# Patient Record
Sex: Female | Born: 1960 | Race: White | Hispanic: No | Marital: Single | Smoking: Former smoker
Health system: Southern US, Community
[De-identification: ages and names within clinical notes are randomized; demographics above are authoritative.]

## PROBLEM LIST (undated history)

## (undated) DIAGNOSIS — M8580 Other specified disorders of bone density and structure, unspecified site: Secondary | ICD-10-CM

## (undated) DIAGNOSIS — I341 Nonrheumatic mitral (valve) prolapse: Secondary | ICD-10-CM

## (undated) DIAGNOSIS — R252 Cramp and spasm: Secondary | ICD-10-CM

## (undated) DIAGNOSIS — R350 Frequency of micturition: Secondary | ICD-10-CM

## (undated) DIAGNOSIS — H9203 Otalgia, bilateral: Secondary | ICD-10-CM

## (undated) DIAGNOSIS — Q892 Congenital malformations of other endocrine glands: Secondary | ICD-10-CM

## (undated) DIAGNOSIS — K219 Gastro-esophageal reflux disease without esophagitis: Secondary | ICD-10-CM

## (undated) DIAGNOSIS — F419 Anxiety disorder, unspecified: Secondary | ICD-10-CM

## (undated) HISTORY — DX: Nonrheumatic mitral (valve) prolapse: I34.1

## (undated) HISTORY — DX: Anxiety disorder, unspecified: F41.9

## (undated) HISTORY — PX: TUBAL LIGATION: SHX77

---
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
## 1998-07-17 ENCOUNTER — Other Ambulatory Visit: Admission: RE | Admit: 1998-07-17 | Discharge: 1998-07-17 | Payer: Self-pay | Admitting: Gynecology

## 1999-07-31 ENCOUNTER — Other Ambulatory Visit: Admission: RE | Admit: 1999-07-31 | Discharge: 1999-07-31 | Payer: Self-pay | Admitting: Gynecology

## 2000-09-10 ENCOUNTER — Other Ambulatory Visit: Admission: RE | Admit: 2000-09-10 | Discharge: 2000-09-10 | Payer: Self-pay | Admitting: Gynecology

## 2001-05-29 ENCOUNTER — Encounter: Payer: Self-pay | Admitting: Family Medicine

## 2001-05-29 ENCOUNTER — Ambulatory Visit (HOSPITAL_COMMUNITY): Admission: RE | Admit: 2001-05-29 | Discharge: 2001-05-29 | Payer: Self-pay | Admitting: Family Medicine

## 2001-07-29 ENCOUNTER — Emergency Department (HOSPITAL_COMMUNITY): Admission: EM | Admit: 2001-07-29 | Discharge: 2001-07-29 | Payer: Self-pay

## 2001-09-15 ENCOUNTER — Other Ambulatory Visit: Admission: RE | Admit: 2001-09-15 | Discharge: 2001-09-15 | Payer: Self-pay | Admitting: Gynecology

## 2002-06-15 ENCOUNTER — Ambulatory Visit (HOSPITAL_COMMUNITY): Admission: RE | Admit: 2002-06-15 | Discharge: 2002-06-15 | Payer: Self-pay | Admitting: Family Medicine

## 2002-06-15 ENCOUNTER — Encounter: Payer: Self-pay | Admitting: Family Medicine

## 2002-09-20 ENCOUNTER — Other Ambulatory Visit: Admission: RE | Admit: 2002-09-20 | Discharge: 2002-09-20 | Payer: Self-pay | Admitting: Gynecology

## 2003-09-26 ENCOUNTER — Other Ambulatory Visit: Admission: RE | Admit: 2003-09-26 | Discharge: 2003-09-26 | Payer: Self-pay | Admitting: Gynecology

## 2004-10-09 ENCOUNTER — Other Ambulatory Visit: Admission: RE | Admit: 2004-10-09 | Discharge: 2004-10-09 | Payer: Self-pay | Admitting: Gynecology

## 2005-10-07 ENCOUNTER — Other Ambulatory Visit: Admission: RE | Admit: 2005-10-07 | Discharge: 2005-10-07 | Payer: Self-pay | Admitting: Gynecology

## 2006-10-06 ENCOUNTER — Other Ambulatory Visit: Admission: RE | Admit: 2006-10-06 | Discharge: 2006-10-06 | Payer: Self-pay | Admitting: Gynecology

## 2007-10-20 ENCOUNTER — Other Ambulatory Visit: Admission: RE | Admit: 2007-10-20 | Discharge: 2007-10-20 | Payer: Self-pay | Admitting: Gynecology

## 2008-10-20 ENCOUNTER — Other Ambulatory Visit: Admission: RE | Admit: 2008-10-20 | Discharge: 2008-10-20 | Payer: Self-pay | Admitting: Gynecology

## 2009-11-03 ENCOUNTER — Encounter: Admission: RE | Admit: 2009-11-03 | Discharge: 2009-11-03 | Payer: Self-pay | Admitting: Gynecology

## 2011-01-13 ENCOUNTER — Encounter: Payer: Self-pay | Admitting: Gynecology

## 2012-03-26 ENCOUNTER — Other Ambulatory Visit: Payer: Self-pay | Admitting: Physician Assistant

## 2012-04-21 ENCOUNTER — Other Ambulatory Visit: Payer: Self-pay | Admitting: Physician Assistant

## 2012-08-05 ENCOUNTER — Other Ambulatory Visit: Payer: Self-pay | Admitting: Physician Assistant

## 2012-08-06 NOTE — Telephone Encounter (Signed)
Chart pulled [id number]

## 2012-08-12 DIAGNOSIS — R350 Frequency of micturition: Secondary | ICD-10-CM | POA: Insufficient documentation

## 2012-08-12 HISTORY — DX: Frequency of micturition: R35.0

## 2012-10-14 ENCOUNTER — Other Ambulatory Visit: Payer: Self-pay | Admitting: Gynecology

## 2012-10-14 DIAGNOSIS — N6459 Other signs and symptoms in breast: Secondary | ICD-10-CM

## 2012-10-16 ENCOUNTER — Ambulatory Visit
Admission: RE | Admit: 2012-10-16 | Discharge: 2012-10-16 | Disposition: A | Discharge status: Home / Self Care | Payer: PRIVATE HEALTH INSURANCE | Source: Ambulatory Visit | Attending: Gynecology | Admitting: Gynecology

## 2012-10-16 DIAGNOSIS — N6459 Other signs and symptoms in breast: Secondary | ICD-10-CM

## 2012-10-29 ENCOUNTER — Other Ambulatory Visit: Payer: Self-pay | Admitting: Family Medicine

## 2012-10-29 DIAGNOSIS — M79609 Pain in unspecified limb: Secondary | ICD-10-CM

## 2012-11-04 ENCOUNTER — Other Ambulatory Visit: Payer: PRIVATE HEALTH INSURANCE

## 2012-11-05 ENCOUNTER — Other Ambulatory Visit: Payer: PRIVATE HEALTH INSURANCE

## 2012-11-13 ENCOUNTER — Other Ambulatory Visit: Payer: PRIVATE HEALTH INSURANCE

## 2012-11-25 ENCOUNTER — Other Ambulatory Visit: Payer: Self-pay | Admitting: Physician Assistant

## 2012-11-25 NOTE — Telephone Encounter (Signed)
Chart pulled to PA pool at nurses station [phone]

## 2013-02-23 ENCOUNTER — Other Ambulatory Visit: Payer: Self-pay | Admitting: Gynecology

## 2013-02-23 DIAGNOSIS — R928 Other abnormal and inconclusive findings on diagnostic imaging of breast: Secondary | ICD-10-CM

## 2013-03-04 ENCOUNTER — Ambulatory Visit
Admission: RE | Admit: 2013-03-04 | Discharge: 2013-03-04 | Disposition: A | Discharge status: Home / Self Care | Payer: PRIVATE HEALTH INSURANCE | Source: Ambulatory Visit | Attending: Gynecology | Admitting: Gynecology

## 2013-03-04 DIAGNOSIS — R928 Other abnormal and inconclusive findings on diagnostic imaging of breast: Secondary | ICD-10-CM

## 2013-04-02 DIAGNOSIS — F411 Generalized anxiety disorder: Secondary | ICD-10-CM | POA: Insufficient documentation

## 2013-04-02 DIAGNOSIS — F17200 Nicotine dependence, unspecified, uncomplicated: Secondary | ICD-10-CM | POA: Insufficient documentation

## 2013-04-02 DIAGNOSIS — M899 Disorder of bone, unspecified: Secondary | ICD-10-CM

## 2013-04-02 HISTORY — DX: Nicotine dependence, unspecified, uncomplicated: F17.200

## 2013-04-02 HISTORY — DX: Generalized anxiety disorder: F41.1

## 2013-04-02 HISTORY — DX: Disorder of bone, unspecified: M89.9

## 2013-04-14 ENCOUNTER — Encounter: Payer: Self-pay | Admitting: Internal Medicine

## 2013-06-11 ENCOUNTER — Encounter: Payer: PRIVATE HEALTH INSURANCE | Admitting: Internal Medicine

## 2013-07-07 ENCOUNTER — Encounter: Payer: Self-pay | Admitting: Internal Medicine

## 2013-10-01 ENCOUNTER — Encounter: Payer: PRIVATE HEALTH INSURANCE | Admitting: Internal Medicine

## 2013-10-22 ENCOUNTER — Ambulatory Visit (INDEPENDENT_AMBULATORY_CARE_PROVIDER_SITE_OTHER): Payer: PRIVATE HEALTH INSURANCE | Admitting: Cardiovascular Disease

## 2013-10-22 ENCOUNTER — Encounter: Payer: Self-pay | Admitting: Cardiovascular Disease

## 2013-10-22 VITALS — BP 90/68 | HR 66 | Ht 65.0 in | Wt 120.0 lb

## 2013-10-22 DIAGNOSIS — I059 Rheumatic mitral valve disease, unspecified: Secondary | ICD-10-CM

## 2013-10-22 DIAGNOSIS — I341 Nonrheumatic mitral (valve) prolapse: Secondary | ICD-10-CM

## 2013-10-22 DIAGNOSIS — R002 Palpitations: Secondary | ICD-10-CM

## 2013-10-25 ENCOUNTER — Encounter: Payer: Self-pay | Admitting: Cardiovascular Disease

## 2013-11-24 ENCOUNTER — Encounter: Payer: Self-pay | Admitting: Internal Medicine

## 2013-11-26 NOTE — Progress Notes (Signed)
Patient ID: Michele Jacobs, female   DOB: 1961-02-03, 52 y.o.   MRN: [id number]     HPI: Michele Jacobs is a 52 y.o. female is a former patient of Dr. Alanda Amass. [facility].  Michele Jacobs last saw Dr. Alanda Amass in July 2013. She is now 52 years old she has a history of palpitations, mitral valve prolapse with minimal mitral regurgitation, and has experienced episodic palpitations. She also has had difficulty with polyuria in the past for which she had seen Dr. Logan Bores. In 2009 she had developed some episodes of chest discomfort with atypical features. A Myoview scan was negative for ischemia. Her last echo Doppler study was in July 2013 which showed normal LV size and function. There was borderline mitral valve prolapse with trace MR. Mild TR. She had normal pulmonary pressures.  Her recent medications have included alprazolam which he takes on a when necessary basis. For periods of increased stress and anxiety. She has been on Toprol XL 25 mg which have controlled her palpitations.  She denies any episodes of chest pain. At times she has noticed an intermittent cough and nasal congestion. She denies any episodes of presyncope or syncope.  Past Medical History  Diagnosis Date  . Mitral valve prolapse   . Anxiety     Past Surgical History  Procedure Laterality Date  . Tubal ligation      Allergies  Allergen Reactions  . Codeine Nausea And Vomiting    Current Outpatient Prescriptions  Medication Sig Dispense Refill  . ALPRAZolam (XANAX) 0.5 MG tablet Take 0.5 tablets by mouth at bedtime as needed.      . TOPROL XL 25 MG 24 hr tablet Take 1 tablet by mouth daily.       No current facility-administered medications for this visit.    History   Social History  . Marital Status: Single    Spouse Name: N/A    Number of Children: N/A  . Years of Education: N/A   Occupational History  . Not on file.   Social History Main Topics  . Smoking  status: Former Smoker    Types: Cigarettes  . Smokeless tobacco: Never Used     Comment: quit in 2009  . Alcohol Use: Not on file  . Drug Use: No  . Sexual Activity: Not on file   Other Topics Concern  . Not on file   Social History Narrative  . No narrative on file    Family History  Problem Relation Age of Onset  . Ovarian cancer Mother   . Lymphoma Father   . Heart disease Father     ROS is negative for fevers, chills or night sweats. She denies change in vision or hearing. She is unaware of lymphadenopathy. She denies bleeding problems. There is a history of palpitations but these have been controlled with beta blocker therapy. At times she does have nasal congestion. There is no chest pain. She denies wheezing. She denies nausea vomiting or diarrhea. There is no claudication. There are no myalgias. There is no diabetes. There is no history of thyroid problems. She denies sleep disordered breathing. Other comprehensive 14 point system review is negative.  PE BP 90/68  Pulse 66  Ht 5\' 5"  (1.651 m)  Wt 120 lb (54.432 kg)  BMI 19.97 kg/m2  Repeat blood pressure 100/70 General: Alert, oriented, no distress.  Skin: normal turgor, no rashes HEENT: Normocephalic, atraumatic. Pupils round and reactive; sclera anicteric;no  lid lag. Extraocular muscles intact. Nose without nasal septal hypertrophy Mouth/Parynx benign; Mallinpatti scale 2 Neck: No JVD, no carotid bruits; normal carotid upstroke Lungs: clear to ausculatation and percussion; no wheezing or rales Chest wall: no tenderness to palpitation Heart: RRR, s1 s2 normal short early 1/6 systolic murmur at the left border. No click. No diastolic murmur or rub. No S3 gallop Abdomen: soft, nontender; no hepatosplenomehaly, BS+; abdominal aorta nontender and not dilated by palpation. Back: no CVA tenderness Pulses 2+ Extremities: no clubbing cyanosis or edema, Homan's sign negative  Neurologic: grossly nonfocal; cranial nerves  grossly normal. Psychologic: normal affect and mood.  ECG (independently read by me): Normal sinus rhythm at 66 beats per minute. No significant ST-T changes. Normal intervals.  LABS:   I did review laboratory from 04/02/2013 done at [facility]. I took out 5 hemoglobin 13 hematocrit 36.6. Renal function was normal with a BUN of 15 creatinine 0.7. Potassium 4.0. She had normal liver function studies.  Total cholesterol 177 triglycerides 49 HDL 86 VLDL 10 LDL cholesterol 80. Vitamin D level 35. Negative urinalysis.  BMET    Component Value Date/Time   NA 141 12/09/2013 2015   K 3.9 12/09/2013 2015   CL 105 12/09/2013 2015   CO2 29 12/09/2013 2015   GLUCOSE 121* 12/09/2013 2015   BUN 19 12/09/2013 2015   CREATININE 0.68 12/09/2013 2015   CALCIUM 9.7 12/09/2013 2015     Hepatic Function Panel  No results found for this basename: prot, albumin, ast, alt, alkphos, bilitot, bilidir, ibili     CBC    Component Value Date/Time   WBC 3.9* 12/13/2013 0801   RBC 4.25 12/13/2013 0801   HGB 13.2 12/13/2013 0801   HCT 41.4 12/13/2013 0801   MCV 97.4* 12/13/2013 0801   MCH 31.1 12/13/2013 0801   MCHC 31.9 12/13/2013 0801     BNP No results found for this basename: probnp    Lipid Panel  No results found for this basename: chol, trig, hdl, cholhdl, vldl, ldlcalc     RADIOLOGY: No results found.    ASSESSMENT AND PLAN: Clinically, Michele Jacobs is remaining stable from a cardiovascular standpoint. Her blood pressure is somewhat low, but she denies associated symptoms of lightheadedness or dizziness. Her EKG is stable. She's not having any palpitations on her low dose beta blocker therapy. I reviewed her prior echocardiographic data from 2013. Her laboratory is within normal limits when taken at [facility]. She will continue her current regimen. I will see her in one year for followup evaluation or sooner if problems  arise.   Lennette Bihari, MD, [facility]  01/20/2014 11:09 PM

## 2013-11-27 ENCOUNTER — Encounter: Payer: Self-pay | Admitting: Cardiovascular Disease

## 2013-12-09 ENCOUNTER — Ambulatory Visit: Payer: No Typology Code available for payment source

## 2013-12-09 ENCOUNTER — Ambulatory Visit (INDEPENDENT_AMBULATORY_CARE_PROVIDER_SITE_OTHER): Payer: No Typology Code available for payment source | Admitting: Family Medicine

## 2013-12-09 VITALS — BP 118/62 | HR 78 | Temp 98.3°F | Resp 16 | Ht 65.0 in | Wt 119.0 lb

## 2013-12-09 DIAGNOSIS — R0602 Shortness of breath: Secondary | ICD-10-CM

## 2013-12-09 DIAGNOSIS — D72819 Decreased white blood cell count, unspecified: Secondary | ICD-10-CM

## 2013-12-09 DIAGNOSIS — R0789 Other chest pain: Secondary | ICD-10-CM

## 2013-12-09 LAB — POCT CBC
Granulocyte percent: 48.4 %G (ref 37–80)
HCT, POC: 39.1 % (ref 37.7–47.9)
Lymph, poc: 1.8 (ref 0.6–3.4)
MCHC: 30.4 g/dL — AB (ref 31.8–35.4)
MCV: 98.4 fL — AB (ref 80–97)
MID (cbc): 0.4 (ref 0–0.9)
POC Granulocyte: 2.1 (ref 2–6.9)
POC LYMPH PERCENT: 42.5 %L (ref 10–50)
Platelet Count, POC: 196 10*3/uL (ref 142–424)
RDW, POC: 12.7 %

## 2013-12-09 NOTE — Progress Notes (Addendum)
[facility] [address] [place] [address] [phone]  Date:  12/09/2013   Name:  Michele Jacobs   DOB:  [birth year]   MRN:  [id number]  PCP:  No primary provider on file.    Chief Complaint: Shortness of Breath   History of Present Illness:  Michele Jacobs is a 52 y.o. very pleasant female patient who presents with the following:  She is here today with possible illness.  She notes that she has felt "SOB" lately for the last 3 or 4 days.  She has a slight cough- dry.  Otherwise she has felt well, no fevers or chills. She has not had this in the past. She did used to be a smoker but quit years ago.  She feels like she will have to take sort of a double breath to get a deep breath in.  She has not been around smokers or any other allergens that she is aware of.   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  No runny nose, no sneezing.   No ST.   No CP, but she does describe a feeling of tightness in her chest that can occur at random, with no relationship to exertion and last for a few minutes- maybe 3 or 4 minutes.  She is not able to further describe how often this may occur or for how long she has noted it.  "I have all these random pains in my body."  Feels that her soreness may be due to her dog sleeping on her  She uses toprol, xanax prn.   No hormone therapy, no OCP.   No history of DVT or PE.   No recent surgery or long trips  She also notes that she may get leg cramps at night, this has gone on for several months.  There are no active problems to display for this patient.   Past Medical History  Diagnosis Date  . Mitral valve prolapse   . Anxiety     Past Surgical History  Procedure Laterality Date  . Tubal ligation      History  Substance Use Topics  . Smoking status: Former Smoker    Types: Cigarettes  . Smokeless tobacco: Never Used     Comment: quit in  2009  . Alcohol Use: Not on file    Family History  Problem Relation Age of Onset  . Ovarian cancer Mother   . Lymphoma Father   . Heart disease Father     Allergies  Allergen Reactions  . Codeine Nausea And Vomiting    Medication list has been reviewed and updated.  Current Outpatient Prescriptions on File Prior to Visit  Medication Sig Dispense Refill  . ALPRAZolam (XANAX) 0.5 MG tablet Take 0.5 tablets by mouth at bedtime as needed.      . TOPROL XL 25 MG 24 hr tablet Take 1 tablet by mouth daily.       No current facility-administered medications on file prior to visit.    Review of Systems:  As per HPI- otherwise negative.   Physical Examination: Filed Vitals:   12/09/13 1942  BP: 118/62  Pulse: 78  Temp: 98.3 F (36.8 C)  Resp: 16   Filed Vitals:   12/09/13 1942  Height: 5\' 5"  (1.651 m)  Weight: 119 lb (53.978 kg)   Body mass index is 19.8 kg/(m^2).  Ideal Body Weight: Weight in (lb) to have BMI = 25: 149.9  GEN: WDWN, NAD, Non-toxic, A & O x 3, looks well HEENT: Atraumatic, Normocephalic. Neck supple. No masses, No LAD.  Bilateral TM wnl, oropharynx normal.  PEERL,EOMI.   Ears and Nose: No external deformity. CV: RRR, No M/G/R. No JVD. No thrill. No extra heart sounds. PULM: CTA B, no wheezes, crackles, rhonchi. No retractions. No resp. distress. No accessory muscle use. ABD: S, NT, ND, +BS. No rebound. No HSM. EXTR: No c/c/e.  No calf swelling or tenderness NEURO Normal gait.  PSYCH: Normally interactive. Conversant. Not depressed or anxious appearing.  Calm demeanor.   [facility] reading (PRIMARY) by  Dr. Patsy Lager. CXR:  Negative  CHEST 2 VIEW  COMPARISON: August 31, 2008  FINDINGS: There is no edema or consolidation. The heart size and pulmonary vascularity are normal. No adenopathy. There is mild lower thoracic levoscoliosis.  IMPRESSION: No edema or consolidation.  EKG:  NSR, no ST elevation or depression.  No tachycardia  Results for  orders placed in visit on 12/09/13  POCT CBC      Result Value Range   WBC 4.3 (*) 4.6 - 10.2 K/uL   Lymph, poc 1.8  0.6 - 3.4   POC LYMPH PERCENT 42.5  10 - 50 %L   MID (cbc) 0.4  0 - 0.9   POC MID % 9.1  0 - 12 %M   POC Granulocyte 2.1  2 - 6.9   Granulocyte percent 48.4  37 - 80 %G   RBC 3.97 (*) 4.04 - 5.48 M/uL   Hemoglobin 11.9 (*) 12.2 - 16.2 g/dL   HCT, POC 16.1  09.6 - 47.9 %   MCV 98.4 (*) 80 - 97 fL   MCH, POC 30.0  27 - 31.2 pg   MCHC 30.4 (*) 31.8 - 35.4 g/dL   RDW, POC 04.5     Platelet Count, POC 196  142 - 424 K/uL   MPV 9.8  0 - 99.8 fL    Assessment and Plan: Shortness of breath - Plan: DG Chest 2 View  Chest tightness - Plan: POCT CBC, Basic metabolic panel, EKG 12-Lead  Michele Jacobs is here today with somewhat vague SOB for the last several days, described as a sensation of having to take a 2nd breath to get a deep breath.  Also describes chest tightness that can occur at random for a few minutes now and then.    Explained that I cannot find a definite explanation for her sx here at clinic today.  It is possible that she has a dangerous heart or lung issue.  I am certainly happy to arrange an ED evaluation for more in- depth investigation of her SOB and chest tighteness.  She declines this for now, prefers to monitor her condition at home and seek care if her sx do not remit  BMP due to recent complaint of leg cramps at night  Signed Abbe Amsterdam, MD  Called to talk to her.  She had not eaten anything for several hours prior to her blood draw.  She will come in for a lab visit only soon for a CBC and A1c.

## 2013-12-10 LAB — BASIC METABOLIC PANEL
BUN: 19 mg/dL (ref 6–23)
Calcium: 9.7 mg/dL (ref 8.4–10.5)
Glucose, Bld: 121 mg/dL — ABNORMAL HIGH (ref 70–99)
Potassium: 3.9 mEq/L (ref 3.5–5.3)

## 2013-12-12 ENCOUNTER — Encounter: Payer: Self-pay | Admitting: Family Medicine

## 2013-12-12 NOTE — Addendum Note (Signed)
Addended by: Abbe Amsterdam C on: 12/12/2013 08:32 PM   Modules accepted: Orders

## 2013-12-13 ENCOUNTER — Ambulatory Visit (INDEPENDENT_AMBULATORY_CARE_PROVIDER_SITE_OTHER): Payer: No Typology Code available for payment source | Admitting: Physician Assistant

## 2013-12-13 DIAGNOSIS — D72819 Decreased white blood cell count, unspecified: Secondary | ICD-10-CM

## 2013-12-13 DIAGNOSIS — R0602 Shortness of breath: Secondary | ICD-10-CM

## 2013-12-13 LAB — POCT CBC
HCT, POC: 41.4 % (ref 37.7–47.9)
Hemoglobin: 13.2 g/dL (ref 12.2–16.2)
Lymph, poc: 1.3 (ref 0.6–3.4)
MCH, POC: 31.1 pg (ref 27–31.2)
MCHC: 31.9 g/dL (ref 31.8–35.4)
MCV: 97.4 fL — AB (ref 80–97)
RBC: 4.25 M/uL (ref 4.04–5.48)
WBC: 3.9 10*3/uL — AB (ref 4.6–10.2)

## 2013-12-13 NOTE — Progress Notes (Signed)
Patient here for labs only. 

## 2013-12-14 ENCOUNTER — Telehealth: Payer: Self-pay

## 2013-12-14 ENCOUNTER — Telehealth: Payer: Self-pay | Admitting: Family Medicine

## 2013-12-14 DIAGNOSIS — D72819 Decreased white blood cell count, unspecified: Secondary | ICD-10-CM

## 2013-12-14 NOTE — Telephone Encounter (Signed)
Patient is returning dr coplands call [phone]

## 2013-12-14 NOTE — Telephone Encounter (Signed)
Called and left detailed message.  A1c is ok- no diabetes. Her wbc count is still a bit low- likely due to recent viral infection.  Will place an order for repeat CBC; lets do this in about one month, lab visit only.  If leukopenia persists then will evaluate further

## 2013-12-15 NOTE — Telephone Encounter (Signed)
Spoke to pt. She is now feeling like she has the flu. Body aches, chest congestion, fever and cough. She states she does not have time to come back into the office as I have advised. She is sure that her count was low due to her coming down with this illness. She will consider coming back to the clinic for blood work in one month.

## 2014-01-20 ENCOUNTER — Encounter: Payer: Self-pay | Admitting: Cardiovascular Disease

## 2014-01-20 DIAGNOSIS — R002 Palpitations: Secondary | ICD-10-CM

## 2014-01-20 DIAGNOSIS — I341 Nonrheumatic mitral (valve) prolapse: Secondary | ICD-10-CM | POA: Insufficient documentation

## 2014-01-20 HISTORY — DX: Palpitations: R00.2

## 2014-01-27 ENCOUNTER — Other Ambulatory Visit: Payer: Self-pay

## 2014-01-27 DIAGNOSIS — Z1231 Encounter for screening mammogram for malignant neoplasm of breast: Secondary | ICD-10-CM

## 2014-02-14 ENCOUNTER — Ambulatory Visit: Payer: 59

## 2014-02-14 ENCOUNTER — Ambulatory Visit (INDEPENDENT_AMBULATORY_CARE_PROVIDER_SITE_OTHER): Payer: 59 | Admitting: Family Medicine

## 2014-02-14 ENCOUNTER — Encounter: Payer: Self-pay | Admitting: Family Medicine

## 2014-02-14 VITALS — BP 102/54 | HR 59 | Temp 98.2°F | Resp 16 | Ht 64.75 in | Wt 116.2 lb

## 2014-02-14 DIAGNOSIS — M25552 Pain in left hip: Secondary | ICD-10-CM

## 2014-02-14 DIAGNOSIS — Z23 Encounter for immunization: Secondary | ICD-10-CM

## 2014-02-14 DIAGNOSIS — M25559 Pain in unspecified hip: Secondary | ICD-10-CM

## 2014-02-14 DIAGNOSIS — Z0000 Encounter for general adult medical examination without abnormal findings: Secondary | ICD-10-CM

## 2014-02-14 DIAGNOSIS — Z13220 Encounter for screening for lipoid disorders: Secondary | ICD-10-CM

## 2014-02-14 DIAGNOSIS — F411 Generalized anxiety disorder: Secondary | ICD-10-CM

## 2014-02-14 DIAGNOSIS — M533 Sacrococcygeal disorders, not elsewhere classified: Secondary | ICD-10-CM

## 2014-02-14 DIAGNOSIS — R002 Palpitations: Secondary | ICD-10-CM

## 2014-02-14 LAB — CBC WITH DIFFERENTIAL/PLATELET
BASOS ABS: 0 10*3/uL (ref 0.0–0.1)
BASOS PCT: 1 % (ref 0–1)
Eosinophils Absolute: 0.1 10*3/uL (ref 0.0–0.7)
Eosinophils Relative: 3 % (ref 0–5)
HCT: 39.8 % (ref 36.0–46.0)
Hemoglobin: 13.5 g/dL (ref 12.0–15.0)
LYMPHS PCT: 34 % (ref 12–46)
Lymphs Abs: 1.4 10*3/uL (ref 0.7–4.0)
MCH: 31.3 pg (ref 26.0–34.0)
MCHC: 33.9 g/dL (ref 30.0–36.0)
MCV: 92.1 fL (ref 78.0–100.0)
Monocytes Absolute: 0.4 10*3/uL (ref 0.1–1.0)
Monocytes Relative: 9 % (ref 3–12)
NEUTROS ABS: 2.2 10*3/uL (ref 1.7–7.7)
Neutrophils Relative %: 53 % (ref 43–77)
PLATELETS: 211 10*3/uL (ref 150–400)
RBC: 4.32 MIL/uL (ref 3.87–5.11)
RDW: 13.8 % (ref 11.5–15.5)
WBC: 4.2 10*3/uL (ref 4.0–10.5)

## 2014-02-14 LAB — COMPREHENSIVE METABOLIC PANEL
ALBUMIN: 4.5 g/dL (ref 3.5–5.2)
ALK PHOS: 80 U/L (ref 39–117)
ALT: 13 U/L (ref 0–35)
AST: 18 U/L (ref 0–37)
BUN: 16 mg/dL (ref 6–23)
CALCIUM: 9.8 mg/dL (ref 8.4–10.5)
CHLORIDE: 105 meq/L (ref 96–112)
CO2: 27 mEq/L (ref 19–32)
CREATININE: 0.69 mg/dL (ref 0.50–1.10)
Glucose, Bld: 91 mg/dL (ref 70–99)
POTASSIUM: 4.9 meq/L (ref 3.5–5.3)
Sodium: 140 mEq/L (ref 135–145)
Total Bilirubin: 1.2 mg/dL (ref 0.2–1.2)
Total Protein: 6.7 g/dL (ref 6.0–8.3)

## 2014-02-14 LAB — LIPID PANEL
CHOL/HDL RATIO: 2.2 ratio
Cholesterol: 178 mg/dL (ref 0–200)
HDL: 80 mg/dL (ref >39)
LDL CALC: 88 mg/dL (ref 0–99)
Triglycerides: 52 mg/dL (ref <150)
VLDL: 10 mg/dL (ref 0–40)

## 2014-02-14 LAB — TSH: TSH: 1.041 u[IU]/mL (ref 0.350–4.500)

## 2014-02-14 MED ORDER — MELOXICAM 7.5 MG PO TABS: 7.5000 mg | ORAL_TABLET | Freq: Every day | ORAL | Status: DC

## 2014-02-14 MED ORDER — METOPROLOL SUCCINATE ER 25 MG PO TB24: 25.0000 mg | ORAL_TABLET | Freq: Every day | ORAL | Status: DC

## 2014-02-14 NOTE — Progress Notes (Deleted)
[facility] [address], [place] [address] [phone]  Date:  02/14/2014   Name:  Michele Jacobs   DOB:  06-09-1961   MRN:  [id number]  PCP:  No PCP Per Patient    Chief Complaint: Annual Exam   History of Present Illness:  Michele Jacobs is a 53 y.o. very pleasant female patient who presents with the following:  ***  Patient Active Problem List   Diagnosis Date Noted  . Mitral valve prolapse 01/20/2014  . Palpitations 01/20/2014    Past Medical History  Diagnosis Date  . Mitral valve prolapse   . Anxiety     Past Surgical History  Procedure Laterality Date  . Tubal ligation      History  Substance Use Topics  . Smoking status: Former Smoker    Types: Cigarettes  . Smokeless tobacco: Never Used     Comment: quit in 2009  . Alcohol Use: Not on file    Family History  Problem Relation Age of Onset  . Ovarian cancer Mother   . Lymphoma Father   . Heart disease Father     Allergies  Allergen Reactions  . Codeine Nausea And Vomiting    Medication list has been reviewed and updated.  Current Outpatient Prescriptions on File Prior to Visit  Medication Sig Dispense Refill  . ALPRAZolam (XANAX) 0.5 MG tablet Take 0.5 tablets by mouth at bedtime as needed.      . TOPROL XL 25 MG 24 hr tablet Take 1 tablet by mouth daily.       No current facility-administered medications on file prior to visit.    Review of Systems:  ***  Physical Examination: There were no vitals filed for this visit. There were no vitals filed for this visit. There is no weight on file to calculate BMI. Ideal Body Weight:    ***  Assessment and Plan: ***  Signed Abbe AmsterdamJessica Kayelee Herbig, MD

## 2014-02-14 NOTE — Progress Notes (Signed)
[facility] [address] [place] [address] [phone]  Date:  02/14/2014   Name:  Michele Jacobs   DOB:  March 10, 1961   MRN:  [id number]  PCP:  No PCP Per Patient    Chief Complaint: Annual Exam   History of Present Illness:  Michele Jacobs is a 53 y.o. very pleasant female patient who presents with the following:  Here today for a CPE.  She was seen in the fall with illness and noted to have mild leukopenia, and slightly high blood glucose. However her A1c was ok at 5.4%.  She is fasting today.   She is doing well today and her only concern is that she has noted some pain in her tailbone- more at night- for the last several weeks. She is not aware of any injury and has not fallen.  Also, sometimes her left hip will feel sore- this comes and goes.   She does see Dr. Chevis Pretty for her OB care.  She will have a mammogram next week as well. She has had a left nipple inversion and keeps a close eye on her mammograms.  So far all ok.   She has not yet had a colonoscopy.  She did have one scheduled but her father got very worried about possible complications  and she canceled it.    She is active at her jobs, but does not do a lot of dedicated exercise  She does take toprol for palpitations daily. She takes xanax 0.25 at bedtime; she takes a 1/2 of a 0.5  Her mother did die of ovarian cancer; she brought a copy of her old labs with her and her OBG did test a CA-125 last year that looked ok.    Patient Active Problem List   Diagnosis Date Noted  . Mitral valve prolapse 01/20/2014  . Palpitations 01/20/2014    Past Medical History  Diagnosis Date  . Mitral valve prolapse   . Anxiety     Past Surgical History  Procedure Laterality Date  . Tubal ligation      History  Substance Use Topics  . Smoking status: Former Smoker    Types: Cigarettes  . Smokeless tobacco: Never Used     Comment: quit in 2009  . Alcohol Use: Not on file    Family History   Problem Relation Age of Onset  . Ovarian cancer Mother   . Lymphoma Father   . Heart disease Father     Allergies  Allergen Reactions  . Codeine Nausea And Vomiting    Medication list has been reviewed and updated.  Current Outpatient Prescriptions on File Prior to Visit  Medication Sig Dispense Refill  . ALPRAZolam (XANAX) 0.5 MG tablet Take 0.5 tablets by mouth at bedtime as needed.      . TOPROL XL 25 MG 24 hr tablet Take 1 tablet by mouth daily.       No current facility-administered medications on file prior to visit.    Review of Systems:  As per HPI- otherwise negative.   Physical Examination: Filed Vitals:   02/14/14 0831  BP: 102/54  Pulse: 59  Temp: 98.2 F (36.8 C)  Resp: 16   Filed Vitals:   02/14/14 0831  Height: 5' 4.75" (1.645 m)  Weight: 116 lb 3.2 oz (52.708 kg)   Body mass index is 19.48 kg/(m^2). Ideal Body Weight: Weight in (lb) to have BMI = 25: 148.8  GEN: WDWN, NAD, Non-toxic, A &  O x 3, slim build, looks well HEENT: Atraumatic, Normocephalic. Neck supple. No masses, No LAD. Ears and Nose: No external deformity. CV: RRR, No M/G/R. No JVD. No thrill. No extra heart sounds. PULM: CTA B, no wheezes, crackles, rhonchi. No retractions. No resp. distress. No accessory muscle use. ABD: S, NT, ND, +BS. No rebound. No HSM. EXTR: No c/c/e NEURO Normal gait.  PSYCH: Normally interactive. Conversant. Not depressed or anxious appearing.  Calm demeanor.  Palpated over her sacrum: it is non- tender, no masses, heat or fluctuance Both hips with normal ROM to internal and external rotation and flexion.  Non- tender over left greater trochanter.   [facility] reading (PRIMARY) by  Dr. Patsy Lageropland. Left ZOX:WRUEAVWUhip:negative Pelvis: negative  SACRUM AND COCCYX - 2+ VIEW  COMPARISON: DG HIP COMPLETE 2+V*L* dated 02/14/2014  FINDINGS: No definite displaced sacral or coccygeal fracture.  Limited visualization of the bilateral SI joints, pubic symphysis and hips is  normal. Regional soft tissues appear normal.  IMPRESSION: No displaced sacral or coccygeal fracture.   EXAM: LEFT HIP - COMPLETE 2+ VIEW  COMPARISON: None.  FINDINGS: No fracture dislocation. Left hip joint spaces appear preserved. No evidence avascular necrosis.  Regional soft tissues appear normal. No radiopaque foreign body.  IMPRESSION: No explanation for patient's left-sided hip pain.   Results for orders placed in visit on 12/13/13  POCT CBC      Result Value Ref Range   WBC 3.9 (*) 4.6 - 10.2 K/uL   Lymph, poc 1.3  0.6 - 3.4   POC LYMPH PERCENT 34.4  10 - 50 %L   MID (cbc) 0.4  0 - 0.9   POC MID % 9.1  0 - 12 %M   POC Granulocyte 2.2  2 - 6.9   Granulocyte percent 56.5  37 - 80 %G   RBC 4.25  4.04 - 5.48 M/uL   Hemoglobin 13.2  12.2 - 16.2 g/dL   HCT, POC 98.141.4  19.137.7 - 47.9 %   MCV 97.4 (*) 80 - 97 fL   MCH, POC 31.1  27 - 31.2 pg   MCHC 31.9  31.8 - 35.4 g/dL   RDW, POC 47.812.9     Platelet Count, POC 193  142 - 424 K/uL   MPV 9.8  0 - 99.8 fL  POCT GLYCOSYLATED HEMOGLOBIN (HGB A1C)      Result Value Ref Range   Hemoglobin A1C 5.4      Assessment and Plan: Physical exam - Plan: CBC with Differential, Comprehensive metabolic panel, TSH  Need for prophylactic vaccination with combined diphtheria-tetanus-pertussis (DTP) vaccine - Plan: Tdap vaccine greater than or equal to 7yo IM  Sacral pain - Plan: DG Sacrum/Coccyx, CANCELED: DG Pelvis 1-2 Views  Left hip pain - Plan: DG Hip Complete Left  Palpitations - Plan: metoprolol succinate (TOPROL XL) 25 MG 24 hr tablet  GAD (generalized anxiety disorder)  Screening for hyperlipidemia - Plan: Lipid panel  Coccydynia - Plan: meloxicam (MOBIC) 7.5 MG tablet  CME today- doing well, she will catch up on her pap and mammogram with OBG.  Encouraged her to reschedule her colonoscopy as she plans to do so.  Tdap today.   QHS xanax for GAD Will try prn mobic of her coccydynia See patient instructions for more  details.   Will plan further follow- up pending labs.   Signed Abbe AmsterdamJessica [name], MD

## 2014-02-14 NOTE — Patient Instructions (Signed)
Consider rescheduling your colonoscopy. This is of course your choice, but I do think your risk of colon cancer is greater than your risk of serious complications from the test.   Use the mobic as needed for pain in your tailbone.  If it does not help please let me know; try taking it for a week or two and see if it helps.    I will be in touch with your labs.   Please ask your OB-GYN; we do not routinely do the CA-125 test.  However if they want this done we can add it on for you.   Ask your OB also about you bone density

## 2014-03-07 ENCOUNTER — Other Ambulatory Visit: Payer: Self-pay

## 2014-03-07 ENCOUNTER — Ambulatory Visit
Admission: RE | Admit: 2014-03-07 | Discharge: 2014-03-07 | Disposition: A | Discharge status: Home / Self Care | Payer: 59 | Source: Ambulatory Visit

## 2014-03-07 DIAGNOSIS — Z1231 Encounter for screening mammogram for malignant neoplasm of breast: Secondary | ICD-10-CM

## 2014-04-02 ENCOUNTER — Other Ambulatory Visit: Payer: Self-pay | Admitting: Family Medicine

## 2014-05-05 ENCOUNTER — Encounter: Payer: Self-pay | Admitting: Family Medicine

## 2014-05-05 DIAGNOSIS — F411 Generalized anxiety disorder: Secondary | ICD-10-CM

## 2014-05-05 MED ORDER — ALPRAZOLAM 0.5 MG PO TABS: 0.2500 mg | ORAL_TABLET | Freq: Every evening | ORAL | Status: DC | PRN

## 2014-06-14 ENCOUNTER — Ambulatory Visit (INDEPENDENT_AMBULATORY_CARE_PROVIDER_SITE_OTHER): Payer: 59 | Admitting: Emergency Medicine

## 2014-06-14 VITALS — BP 126/71 | HR 70 | Temp 98.3°F | Resp 16 | Ht 65.0 in | Wt 120.8 lb

## 2014-06-14 DIAGNOSIS — F4323 Adjustment disorder with mixed anxiety and depressed mood: Secondary | ICD-10-CM

## 2014-06-14 NOTE — Progress Notes (Signed)
[facility] [address], [place] [address] [phone]  Date:  06/14/2014   Name:  Michele NordmannJanice Y Jacobs   DOB:  1961/12/08   MRN:  [id number]  PCP:  Abbe AmsterdamOPLAND,JESSICA, MD    Chief Complaint: Shortness of Breath   History of Present Illness:  Michele NordmannJanice Y Jacobs is a 53 y.o. very pleasant female patient who presents with the following:  Says she has a 3 week history of burning in her throat when she is outside in the hot air.  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  No cough or coryza.  No fever or chills.  No hemoptysis, wheezing, orthopnea or PND.  Some shortness of breath at rest.  Feels as though she is not getting a satisfying breath.  Anxious.  Caring for father with end stage cancer after her mother died in 2010 of cancer.  Eating normally.  Poor sleep and often tired.  No improvement with over the counter medications or other home remedies. Denies other complaint or health concern today.   Patient Active Problem List   Diagnosis Date Noted  . Mitral valve prolapse 01/20/2014  . Palpitations 01/20/2014    Past Medical History  Diagnosis Date  . Mitral valve prolapse   . Anxiety     Past Surgical History  Procedure Laterality Date  . Tubal ligation      History  Substance Use Topics  . Smoking status: Former Smoker    Types: Cigarettes  . Smokeless tobacco: Never Used     Comment: quit in 2009  . Alcohol Use: Not on file    Family History  Problem Relation Age of Onset  . Ovarian cancer Mother   . Cancer Mother   . Lymphoma Father   . Heart disease Father   . Cancer Father     Allergies  Allergen Reactions  . Codeine Nausea And Vomiting    Medication list has been reviewed and updated.  Current Outpatient Prescriptions on File Prior to Visit  Medication Sig Dispense Refill  . ALPRAZolam (XANAX) 0.5 MG tablet Take 0.5 tablets (0.25 mg total) by mouth at bedtime as needed.  30 tablet  1  . TOPROL XL 25 MG  24 hr tablet TAKE 1 TABLET EVERY DAY  30 tablet  11   No current facility-administered medications on file prior to visit.    Review of Systems:  As per HPI, otherwise negative.    Physical Examination: Filed Vitals:   06/14/14 1548  BP: 126/71  Pulse: 70  Temp: 98.3 F (36.8 C)  Resp: 16   Filed Vitals:   06/14/14 1548  Height: 5\' 5"  (1.651 m)  Weight: 120 lb 12.8 oz (54.795 kg)   Body mass index is 20.1 kg/(m^2). Ideal Body Weight: Weight in (lb) to have BMI = 25: 149.9  GEN: WDWN, NAD, Non-toxic, A & O x 3 HEENT: Atraumatic, Normocephalic. Neck supple. No masses, No LAD. Ears and Nose: No external deformity. CV: RRR, No M/G/R. No JVD. No thrill. No extra heart sounds. PULM: CTA B, no wheezes, crackles, rhonchi. No retractions. No resp. distress. No accessory muscle use. ABD: S, NT, ND, +BS. No rebound. No HSM. EXTR: No c/c/e NEURO Normal gait.  PSYCH: Normally interactive. Conversant. Not depressed appearing.  Anxious demeanor.    Assessment and Plan: Anxiety reaction Depression Refuses medication   Signed,  Phillips OdorJeffery Jodell Weitman, MD

## 2014-07-06 ENCOUNTER — Other Ambulatory Visit: Payer: Self-pay | Admitting: Family Medicine

## 2014-07-06 DIAGNOSIS — G4700 Insomnia, unspecified: Secondary | ICD-10-CM

## 2014-07-07 NOTE — Telephone Encounter (Signed)
Called in.

## 2014-08-22 DIAGNOSIS — R252 Cramp and spasm: Secondary | ICD-10-CM

## 2014-08-22 DIAGNOSIS — H9203 Otalgia, bilateral: Secondary | ICD-10-CM

## 2014-08-22 HISTORY — DX: Cramp and spasm: R25.2

## 2014-08-22 HISTORY — DX: Otalgia, bilateral: H92.03

## 2015-02-09 ENCOUNTER — Other Ambulatory Visit: Payer: Self-pay | Admitting: Family Medicine

## 2015-02-10 NOTE — Telephone Encounter (Signed)
Dr Patsy Lageropland, you haven't seen pt for a year, but she does have appt sch for 04/24/15.

## 2015-02-11 NOTE — Telephone Encounter (Signed)
rx printed.  Meds ordered this encounter  Medications  . ALPRAZolam (XANAX) 0.5 MG tablet    Sig: TAKE 1/2 TABLET AT BEDTIME AS NEEDED FOR ANXIETY    Dispense:  30 tablet    Refill:  0    This request is for a new prescription for a controlled substance as required by Federal/State law..Marland Kitchen

## 2015-02-11 NOTE — Telephone Encounter (Signed)
Please approve #[id number] xanax .5 for her- she takes 1/2 qhs.  Thank you

## 2015-02-13 NOTE — Telephone Encounter (Signed)
Faxed

## 2015-02-21 LAB — HM MAMMOGRAPHY: HM MAMMO: NORMAL (ref 0–4)

## 2015-02-27 ENCOUNTER — Other Ambulatory Visit: Payer: Self-pay

## 2015-02-27 MED ORDER — TOPROL XL 25 MG PO TB24: 25.0000 mg | ORAL_TABLET | Freq: Every day | ORAL | Status: DC

## 2015-04-24 ENCOUNTER — Encounter: Payer: Self-pay | Admitting: Family Medicine

## 2015-04-24 ENCOUNTER — Ambulatory Visit (INDEPENDENT_AMBULATORY_CARE_PROVIDER_SITE_OTHER): Payer: 59 | Admitting: Family Medicine

## 2015-04-24 VITALS — BP 94/66 | HR 63 | Temp 98.6°F | Resp 16 | Ht 65.0 in | Wt 115.0 lb

## 2015-04-24 DIAGNOSIS — Z130 Encounter for screening for diseases of the blood and blood-forming organs and certain disorders involving the immune mechanism: Secondary | ICD-10-CM | POA: Diagnosis not present

## 2015-04-24 DIAGNOSIS — Z0000 Encounter for general adult medical examination without abnormal findings: Secondary | ICD-10-CM | POA: Diagnosis not present

## 2015-04-24 DIAGNOSIS — M8580 Other specified disorders of bone density and structure, unspecified site: Secondary | ICD-10-CM

## 2015-04-24 DIAGNOSIS — R002 Palpitations: Secondary | ICD-10-CM | POA: Diagnosis not present

## 2015-04-24 DIAGNOSIS — Z13220 Encounter for screening for lipoid disorders: Secondary | ICD-10-CM

## 2015-04-24 DIAGNOSIS — Z131 Encounter for screening for diabetes mellitus: Secondary | ICD-10-CM | POA: Diagnosis not present

## 2015-04-24 DIAGNOSIS — R17 Unspecified jaundice: Secondary | ICD-10-CM | POA: Diagnosis not present

## 2015-04-24 DIAGNOSIS — Z1211 Encounter for screening for malignant neoplasm of colon: Secondary | ICD-10-CM

## 2015-04-24 DIAGNOSIS — L710 Perioral dermatitis: Secondary | ICD-10-CM

## 2015-04-24 HISTORY — DX: Other specified disorders of bone density and structure, unspecified site: M85.80

## 2015-04-24 LAB — CBC
HCT: 40.2 % (ref 36.0–46.0)
Hemoglobin: 13.2 g/dL (ref 12.0–15.0)
MCH: 30.9 pg (ref 26.0–34.0)
MCHC: 32.8 g/dL (ref 30.0–36.0)
MCV: 94.1 fL (ref 78.0–100.0)
MPV: 10.8 fL (ref 8.6–12.4)
PLATELETS: 235 10*3/uL (ref 150–400)
RBC: 4.27 MIL/uL (ref 3.87–5.11)
RDW: 12.8 % (ref 11.5–15.5)
WBC: 4.8 10*3/uL (ref 4.0–10.5)

## 2015-04-24 LAB — COMPREHENSIVE METABOLIC PANEL
ALK PHOS: 90 U/L (ref 39–117)
ALT: 16 U/L (ref 0–35)
AST: 18 U/L (ref 0–37)
Albumin: 4.5 g/dL (ref 3.5–5.2)
BUN: 17 mg/dL (ref 6–23)
CHLORIDE: 105 meq/L (ref 96–112)
CO2: 27 meq/L (ref 19–32)
Calcium: 9.8 mg/dL (ref 8.4–10.5)
Creat: 0.68 mg/dL (ref 0.50–1.10)
Glucose, Bld: 85 mg/dL (ref 70–99)
POTASSIUM: 4.5 meq/L (ref 3.5–5.3)
SODIUM: 142 meq/L (ref 135–145)
TOTAL PROTEIN: 6.8 g/dL (ref 6.0–8.3)
Total Bilirubin: 1.8 mg/dL — ABNORMAL HIGH (ref 0.2–1.2)

## 2015-04-24 LAB — LIPID PANEL
Cholesterol: 175 mg/dL (ref 0–200)
HDL: 83 mg/dL (ref >=46)
LDL CALC: 80 mg/dL (ref 0–99)
Total CHOL/HDL Ratio: 2.1 Ratio
Triglycerides: 58 mg/dL (ref <150)
VLDL: 12 mg/dL (ref 0–40)

## 2015-04-24 LAB — TSH: TSH: 1.306 u[IU]/mL (ref 0.350–4.500)

## 2015-04-24 MED ORDER — TOPROL XL 25 MG PO TB24: 25.0000 mg | ORAL_TABLET | Freq: Every day | ORAL | Status: DC

## 2015-04-24 MED ORDER — METRONIDAZOLE 1 % EX GEL: Freq: Every day | CUTANEOUS | Status: DC

## 2015-04-24 MED ORDER — ALPRAZOLAM 0.25 MG PO TABS: ORAL_TABLET | ORAL | Status: DC

## 2015-04-24 NOTE — Patient Instructions (Signed)
I will be in touch with your labs asap Keep up the good work with exercise.  I might also encourage you to gain just a few lbs; this may help keep your bones strong  Try the metrogel once a day on your chin rash. You can also try a mild OTC hydrocortisone cream a couple of times a week for short term (a few weeks)  In between these medications use a mild moisturizer such as cetaphil cream.   Let me know if this does not help in 2-3 weeks

## 2015-04-24 NOTE — Progress Notes (Signed)
[facility] [address], [place] [address] [phone]  Date:  04/24/2015   Name:  Michele NordmannJanice Y Cuffee   DOB:  1961/05/14   MRN:  [id number]  PCP:  Abbe AmsterdamOPLAND,Ninah Moccio, MD    Chief Complaint: Annual Exam and Rash   History of Present Illness:  Michele NordmannJanice Y Kazlauskas is a 54 y.o. very pleasant female patient who presents with the following:  Here today for a CPE.  Her mamo and pap are UTD.  Immunizations also UTD.   She has not yet had a colonoscopy and is concerned about possible perforation accidentls.  She would like to do a virtual colonoscopy; she understands that this may mean going back in again for a polypectomy  She would like to do a vitamin D level today.  She had a dexa scan at her OBG recently which showed osteopenia, stable from 2 years ago.  She is fasting today for labs.   She is working on the treadmill at her dad's place and also walking outside some.   She notes a rash on her chin over the last couple of months- she has been using some OTC acne products for this but it does not seem to be helping  She uses xanax a couple of times a week   She uses toprol xl for her heart palp- she needs brand only Patient Active Problem List   Diagnosis Date Noted  . Mitral valve prolapse 01/20/2014  . Palpitations 01/20/2014    Past Medical History  Diagnosis Date  . Mitral valve prolapse   . Anxiety     Past Surgical History  Procedure Laterality Date  . Tubal ligation      History  Substance Use Topics  . Smoking status: Former Smoker    Types: Cigarettes  . Smokeless tobacco: Never Used     Comment: quit in 2009  . Alcohol Use: Yes    Family History  Problem Relation Age of Onset  . Ovarian cancer Mother   . Cancer Mother   . Lymphoma Father   . Heart disease Father   . Cancer Father   . Non-Hodgkin's lymphoma Father     Allergies  Allergen Reactions  . Codeine Nausea And Vomiting    Medication list has been reviewed and  updated.  Current Outpatient Prescriptions on File Prior to Visit  Medication Sig Dispense Refill  . ALPRAZolam (XANAX) 0.5 MG tablet TAKE 1/2 TABLET AT BEDTIME AS NEEDED FOR ANXIETY 30 tablet 0  . TOPROL XL 25 MG 24 hr tablet Take 1 tablet (25 mg total) by mouth daily. PATIENT NEEDS OFFICE VISIT FOR ADDITIONAL REFILLS 30 tablet 0   No current facility-administered medications on file prior to visit.    Review of Systems:  As per HPI- otherwise negative.   Physical Examination: Filed Vitals:   04/24/15 0842  BP: 94/66  Pulse: 63  Temp: 98.6 F (37 C)  Resp: 16   Filed Vitals:   04/24/15 0842  Height: 5\' 5"  (1.651 m)  Weight: 115 lb (52.164 kg)   Body mass index is 19.14 kg/(m^2). Ideal Body Weight: Weight in (lb) to have BMI = 25: 149.9  GEN: WDWN, NAD, Non-toxic, A & O x 3, small, slight build HEENT: Atraumatic, Normocephalic. Neck supple. No masses, No LAD.  Bilateral TM wnl, oropharynx normal.  PEERL,EOMI.   Ears and Nose: No external deformity. CV: RRR, No M/G/R. No JVD. No thrill. No extra heart sounds. PULM: CTA B, no  wheezes, crackles, rhonchi. No retractions. No resp. distress. No accessory muscle use. ABD: S, NT, ND . No rebound. No HSM. EXTR: No c/c/e NEURO Normal gait.  PSYCH: Normally interactive. Conversant. Not depressed or anxious appearing.  Calm demeanor.  Perioral dermatitis on her bilateral chin with irritation   Assessment and Plan: Physical exam  Heart palpitations - Plan: ALPRAZolam (XANAX) 0.25 MG tablet, TOPROL XL 25 MG 24 hr tablet, TSH  Screening for hyperlipidemia - Plan: Lipid panel  Osteopenia - Plan: Vitamin D, 25-hydroxy, TSH  Screening for diabetes mellitus - Plan: Comprehensive metabolic panel  Screening for deficiency anemia - Plan: CBC  Perioral dermatitis - Plan: metroNIDAZOLE (METROGEL) 1 % gel  Screening for colon cancer - Plan: CT Virtual Colonoscopy Diagnostic   Did her refills. Advised her to DC OTC acne products  which are likely making her skin worse- will try metrogel instead, let me know if not better soon Did referral for virtual colon Will plan further follow- up pending labs. Encouraged her to try and gain a little weight   Signed Abbe Amsterdam, MD

## 2015-04-25 LAB — VITAMIN D 25 HYDROXY (VIT D DEFICIENCY, FRACTURES): Vit D, 25-Hydroxy: 27 ng/mL — ABNORMAL LOW (ref 30–100)

## 2015-04-25 NOTE — Addendum Note (Signed)
Addended by: Abbe AmsterdamOPLAND, Vertis Bauder C on: 04/25/2015 09:01 PM   Modules accepted: Orders

## 2015-04-27 ENCOUNTER — Encounter: Payer: Self-pay | Admitting: Family Medicine

## 2015-05-01 ENCOUNTER — Telehealth: Payer: Self-pay

## 2015-05-01 MED ORDER — METRONIDAZOLE 0.75 % EX GEL: CUTANEOUS | Status: DC

## 2015-05-01 NOTE — Telephone Encounter (Signed)
PA denied for Metronidazole gel. The insurance will only cover 0.75% gel. Please advise on medication change.

## 2015-05-22 ENCOUNTER — Encounter: Payer: Self-pay | Admitting: Family Medicine

## 2015-05-22 DIAGNOSIS — L309 Dermatitis, unspecified: Secondary | ICD-10-CM

## 2015-05-30 ENCOUNTER — Telehealth: Payer: Self-pay

## 2015-06-06 ENCOUNTER — Telehealth: Payer: Self-pay

## 2015-06-06 NOTE — Telephone Encounter (Signed)
Please call for peer to peer thanks  Patient Name: Michele Jacobs Patient Id: [id number] Insurance Carrier: [employer]   Diagnosis: Z12.11  Description: Encounter for screening for malignant neoplasm of colon  CPT Code 83254  Description: CT COLONOGRAPHY, DIAGNOSTIC  Case Number: [id number]  Review Date: 04/25/2015 9:45:45 AM  Expiration Date: N/A

## 2015-06-06 NOTE — Telephone Encounter (Signed)
Peer to Peer will not allow the ct colonography without indications of coagulation disorder, recent bleeds, problem with a previous colonoscopy.  We will consider another modality at this time for colon cancer screening, and continue to counsel her of colonoscopy.

## 2015-10-30 ENCOUNTER — Ambulatory Visit: Payer: 59 | Admitting: Family Medicine

## 2015-11-01 ENCOUNTER — Encounter: Payer: Self-pay | Admitting: Family Medicine

## 2015-11-01 ENCOUNTER — Ambulatory Visit (INDEPENDENT_AMBULATORY_CARE_PROVIDER_SITE_OTHER): Payer: 59 | Admitting: Family Medicine

## 2015-11-01 VITALS — BP 124/62 | HR 66 | Temp 98.1°F | Resp 16 | Ht 65.0 in | Wt 120.4 lb

## 2015-11-01 DIAGNOSIS — Z119 Encounter for screening for infectious and parasitic diseases, unspecified: Secondary | ICD-10-CM | POA: Diagnosis not present

## 2015-11-01 DIAGNOSIS — E559 Vitamin D deficiency, unspecified: Secondary | ICD-10-CM | POA: Diagnosis not present

## 2015-11-01 DIAGNOSIS — Z23 Encounter for immunization: Secondary | ICD-10-CM | POA: Diagnosis not present

## 2015-11-01 DIAGNOSIS — H6991 Unspecified Eustachian tube disorder, right ear: Secondary | ICD-10-CM

## 2015-11-01 DIAGNOSIS — H6981 Other specified disorders of Eustachian tube, right ear: Secondary | ICD-10-CM | POA: Diagnosis not present

## 2015-11-01 DIAGNOSIS — F411 Generalized anxiety disorder: Secondary | ICD-10-CM | POA: Diagnosis not present

## 2015-11-01 LAB — HEPATIC FUNCTION PANEL
ALK PHOS: 90 U/L (ref 33–130)
ALT: 15 U/L (ref 6–29)
AST: 19 U/L (ref 10–35)
Albumin: 4.3 g/dL (ref 3.6–5.1)
BILIRUBIN INDIRECT: 1.1 mg/dL (ref 0.2–1.2)
Bilirubin, Direct: 0.3 mg/dL — ABNORMAL HIGH (ref <=0.2)
TOTAL PROTEIN: 6.4 g/dL (ref 6.1–8.1)
Total Bilirubin: 1.4 mg/dL — ABNORMAL HIGH (ref 0.2–1.2)

## 2015-11-01 NOTE — Progress Notes (Signed)
[facility] [address], [place] [address] [phone]  Date:  11/01/2015   Name:  Michele Jacobs   DOB:  06-10-1961   MRN:  [id number]  PCP:  Abbe Amsterdam, MD    Chief Complaint: Follow-up; Anxiety; and Referral to ENT   History of Present Illness:  Michele Jacobs is a 54 y.o. very pleasant female patient who presents with the following:  Here today for a 6 month follow-up visit.  Physical in May of this year.   She has gained 5 lbs.  She does not really like the weight gain but knows it is a healthier weight for her  Her anxiety is about the same Uses xanax a couple of times a week Needs flu shot today  She is taking her vitamin D supplement- will recheck level today  She has noted her right ear popping, thinks that her ET tube probably came out.   - needs a referral back to her ENT, she sees Dr. Jearld Fenton.  He placed a right TM tube back in 2015  She is a former smoker, uses toprol for palpitations  Wt Readings from Last 3 Encounters:  11/01/15 120 lb 6.4 oz (54.613 kg)  04/24/15 115 lb (52.164 kg)  06/14/14 120 lb 12.8 oz (54.795 kg)     Patient Active Problem List   Diagnosis Date Noted  . Heart palpitations 04/24/2015  . Osteopenia 04/24/2015  . Mitral valve prolapse 01/20/2014  . Palpitations 01/20/2014    Past Medical History  Diagnosis Date  . Mitral valve prolapse   . Anxiety     Past Surgical History  Procedure Laterality Date  . Tubal ligation      Social History  Substance Use Topics  . Smoking status: Former Smoker    Types: Cigarettes  . Smokeless tobacco: Never Used     Comment: quit in 2009  . Alcohol Use: Yes    Family History  Problem Relation Age of Onset  . Ovarian cancer Mother   . Cancer Mother   . Lymphoma Father   . Heart disease Father   . Cancer Father   . Non-Hodgkin's lymphoma Father     Allergies  Allergen Reactions  . Codeine Nausea And Vomiting    Medication list has been reviewed  and updated.  Current Outpatient Prescriptions on File Prior to Visit  Medication Sig Dispense Refill  . ALPRAZolam (XANAX) 0.25 MG tablet Take 1 daily as needed 30 tablet 1  . TOPROL XL 25 MG 24 hr tablet Take 1 tablet (25 mg total) by mouth daily. Brand only please 30 tablet 11   No current facility-administered medications on file prior to visit.    Review of Systems:  As per HPI- otherwise negative.   Physical Examination: Filed Vitals:   11/01/15 0808  BP: 124/62  Pulse: 66  Temp: 98.1 F (36.7 C)  Resp: 16   Filed Vitals:   11/01/15 0808  Height:  (1.651 m)  Weight: 120 lb 6.4 oz (54.613 kg)   Body mass index is 20.04 kg/(m^2). Ideal Body Weight: Weight in (lb) to have BMI = 25: 149.9  GEN: WDWN, NAD, Non-toxic, A & O x 3, slim build but looks better, has gained a few labs HEENT: Atraumatic, Normocephalic. Neck supple. No masses, No LAD. Right Tm tube is sitting in ear canal.  Could not easily remove so left in place Ears and Nose: No external deformity. CV: RRR, No M/G/R. No JVD.  No thrill. No extra heart sounds. PULM: CTA B, no wheezes, crackles, rhonchi. No retractions. No resp. distress. No accessory muscle use. EXTR: No c/c/e NEURO Normal gait.  PSYCH: Normally interactive. Conversant. Not depressed or anxious appearing.  Calm demeanor.    Assessment and Plan: GAD (generalized anxiety disorder)  Flu vaccine need - Plan: Flu Vaccine QUAD 36+ mos IM  Vitamin D deficiency - Plan: Vitamin D, 25-hydroxy  Screening examination for infectious disease - Plan: Hepatitis C antibody  Hyperbilirubinemia - Plan: Hepatic Function Panel  ETD (eustachian tube dysfunction), right - Plan: Ambulatory referral to ENT  Flu shot today Her anxiety is stable, she will let me know when xanax RF needed Repeat vitamin D level to follow-up on deficiency at last visit Also recheck elevated bilirubin Screen for hep c Referral back to her ENT  Signed Abbe AmsterdamJessica Titianna Loomis,  MD

## 2015-11-01 NOTE — Patient Instructions (Signed)
It was good to see you today! I will be in touch with your labs asap I made the referral to Dr. Jearld FentonByers but did not schedule an appt since you are an established pt.  You can call and schedule an appt time that works for you We will check on your vitamin D, recheck your bilirubin level and do your one-time hepatitis C screening today Let's plan to recheck in 6 months

## 2015-11-02 LAB — HEPATITIS C ANTIBODY: HCV Ab: NEGATIVE

## 2015-11-02 LAB — VITAMIN D 25 HYDROXY (VIT D DEFICIENCY, FRACTURES): Vit D, 25-Hydroxy: 23 ng/mL — ABNORMAL LOW (ref 30–100)

## 2015-11-02 MED ORDER — VITAMIN D (ERGOCALCIFEROL) 1.25 MG (50000 UNIT) PO CAPS: 50000.0000 [IU] | ORAL_CAPSULE | ORAL | Status: DC

## 2015-11-02 NOTE — Addendum Note (Signed)
Addended by: Abbe AmsterdamOPLAND, [name] on: 11/02/2015 04:30 PM   Modules accepted: Orders

## 2015-11-08 ENCOUNTER — Encounter: Payer: Self-pay | Admitting: Family Medicine

## 2015-11-08 ENCOUNTER — Ambulatory Visit (INDEPENDENT_AMBULATORY_CARE_PROVIDER_SITE_OTHER): Payer: 59 | Admitting: Physician Assistant

## 2015-11-08 VITALS — BP 100/66 | HR 71 | Temp 98.3°F | Ht 65.0 in | Wt 120.0 lb

## 2015-11-08 DIAGNOSIS — R3915 Urgency of urination: Secondary | ICD-10-CM | POA: Diagnosis not present

## 2015-11-08 DIAGNOSIS — R109 Unspecified abdominal pain: Secondary | ICD-10-CM

## 2015-11-08 DIAGNOSIS — M62830 Muscle spasm of back: Secondary | ICD-10-CM | POA: Diagnosis not present

## 2015-11-08 LAB — POCT URINALYSIS DIP (MANUAL ENTRY)
Bilirubin, UA: NEGATIVE
GLUCOSE UA: NEGATIVE
Ketones, POC UA: NEGATIVE
LEUKOCYTES UA: NEGATIVE
Nitrite, UA: NEGATIVE
PROTEIN UA: NEGATIVE
Spec Grav, UA: <=1.005
UROBILINOGEN UA: 0.2
pH, UA: 6

## 2015-11-08 LAB — POC MICROSCOPIC URINALYSIS (UMFC): MUCUS RE: ABSENT

## 2015-11-08 MED ORDER — PHENAZOPYRIDINE HCL 200 MG PO TABS: 200.0000 mg | ORAL_TABLET | Freq: Three times a day (TID) | ORAL | Status: DC | PRN

## 2015-11-08 MED ORDER — IBUPROFEN 600 MG PO TABS: 600.0000 mg | ORAL_TABLET | Freq: Three times a day (TID) | ORAL | Status: DC | PRN

## 2015-11-08 NOTE — Progress Notes (Signed)
Subjective:    Patient ID: Michele Jacobs, female    DOB: November 06, 1961, 54 y.o.   MRN: [id number]  HPI Patient presents for lower back pain that has been present since yesterday. Pain does not radiate and is achy in quality. Adds that has had urinary urgency, but has not been able to actually go to the bathroom when she feels she needs to go. Feels similar to when she has had a UTI in the past. Denies hematuria, dysuria, constipation, diarrhea, fever, N/V, abdominal/flank pain, or vaginal discharge. Is not sexually active and went through menopause 3 years ago. Med allergy codeine.   Review of Systems As noted above.    Objective:   Physical Exam  Constitutional: She is oriented to person, place, and time. She appears well-developed and well-nourished. No distress.  Blood pressure 100/66, pulse 71, temperature 98.3 F (36.8 C), temperature source Oral, height  (1.651 m), weight 120 lb (54.432 kg), SpO2 98 %.   HENT:  Head: Normocephalic and atraumatic.  Right Ear: External ear normal.  Left Ear: External ear normal.  Eyes: Conjunctivae are normal. Pupils are equal, round, and reactive to light. Right eye exhibits no discharge. Left eye exhibits no discharge. No scleral icterus.  Neck: Normal range of motion. Neck supple. No thyromegaly present.  Cardiovascular: Normal rate, regular rhythm and normal heart sounds.  Exam reveals no gallop and no friction rub.   No murmur heard. Pulmonary/Chest: Effort normal and breath sounds normal. No respiratory distress. She has no wheezes. She has no rales.  Abdominal: Soft. Bowel sounds are normal. She exhibits no distension. There is no hepatosplenomegaly. There is no tenderness. There is no rebound, no guarding and no CVA tenderness. No hernia.  Musculoskeletal: Normal range of motion.       Right hip: Normal.       Left hip: Normal.       Cervical back: Normal.       Thoracic back: Normal.       Lumbar back: She exhibits tenderness. She  exhibits normal range of motion, no bony tenderness, no swelling, no edema, no deformity, no laceration, no pain and no spasm.       Back:  Negative straight leg test  Lymphadenopathy:    She has no cervical adenopathy.  Neurological: She is alert and oriented to person, place, and time.  Skin: Skin is warm and dry. No rash noted. She is not diaphoretic. No erythema. No pallor.  Psychiatric: She has a normal mood and affect. Her behavior is normal. Judgment and thought content normal.     Results for orders placed or performed in visit on 11/08/15  POCT urinalysis dipstick  Result Value Ref Range   Color, UA yellow yellow   Clarity, UA clear clear   Glucose, UA negative negative   Bilirubin, UA negative negative   Ketones, POC UA negative negative   Spec Grav, UA <=1.005    Blood, UA trace-lysed (A) negative   pH, UA 6.0    Protein Ur, POC negative negative   Urobilinogen, UA 0.2    Nitrite, UA Negative Negative   Leukocytes, UA Negative Negative  POCT Microscopic Urinalysis ([facility])  Result Value Ref Range   WBC,UR,HPF,POC None None WBC/hpf   RBC,UR,HPF,POC None None RBC/hpf   Bacteria None None, Too numerous to count   Mucus Absent Absent   Epithelial Cells, UR Per Microscopy None None, Too numerous to count cells/hpf       Assessment &  Plan:  1. Right flank pain - POCT urinalysis dipstick - POCT Microscopic Urinalysis ([facility])  2. Urgency of urination - POCT urinalysis dipstick - POCT Microscopic Urinalysis ([facility]) - pyridium TID  3. Muscle spasm of back - ibuprofen (ADVIL,MOTRIN) 600 MG tablet; Take 1 tablet (600 mg total) by mouth every 8 (eight) hours as needed.  Dispense: 30 tablet; Refill: 0   Umeka Wrench PA-C  [facility] 11/08/2015 10:58 AM

## 2015-11-14 ENCOUNTER — Ambulatory Visit
Admission: RE | Admit: 2015-11-14 | Discharge: 2015-11-14 | Disposition: A | Discharge status: Home / Self Care | Payer: 59 | Source: Ambulatory Visit | Attending: Family Medicine | Admitting: Family Medicine

## 2015-12-21 ENCOUNTER — Other Ambulatory Visit: Payer: Self-pay | Admitting: Family Medicine

## 2016-01-12 ENCOUNTER — Encounter: Payer: Self-pay | Admitting: Family Medicine

## 2016-01-17 ENCOUNTER — Encounter: Payer: Self-pay | Admitting: Family Medicine

## 2016-01-18 ENCOUNTER — Telehealth: Payer: Self-pay | Admitting: Family Medicine

## 2016-01-18 NOTE — Telephone Encounter (Signed)
lmom to call and set up an appt with one of our other providers she was a [name] pt 

## 2016-03-27 ENCOUNTER — Encounter: Payer: Self-pay | Admitting: Family Medicine

## 2016-04-09 ENCOUNTER — Telehealth: Payer: Self-pay | Admitting: *Deleted

## 2016-04-09 ENCOUNTER — Encounter: Payer: Self-pay | Admitting: *Deleted

## 2016-04-09 NOTE — Telephone Encounter (Signed)
Pre-Visit Call completed with patient and chart updated.   Pre-Visit Info documented in Specialty Comments under [employer].    

## 2016-04-10 ENCOUNTER — Ambulatory Visit (INDEPENDENT_AMBULATORY_CARE_PROVIDER_SITE_OTHER): Payer: BLUE CROSS/BLUE SHIELD | Admitting: Family Medicine

## 2016-04-10 ENCOUNTER — Encounter: Payer: Self-pay | Admitting: Family Medicine

## 2016-04-10 VITALS — BP 90/62 | HR 66 | Temp 97.9°F | Ht 65.0 in | Wt 119.0 lb

## 2016-04-10 DIAGNOSIS — Z130 Encounter for screening for diseases of the blood and blood-forming organs and certain disorders involving the immune mechanism: Secondary | ICD-10-CM | POA: Diagnosis not present

## 2016-04-10 DIAGNOSIS — Z131 Encounter for screening for diabetes mellitus: Secondary | ICD-10-CM | POA: Diagnosis not present

## 2016-04-10 DIAGNOSIS — Z1329 Encounter for screening for other suspected endocrine disorder: Secondary | ICD-10-CM

## 2016-04-10 DIAGNOSIS — R002 Palpitations: Secondary | ICD-10-CM

## 2016-04-10 DIAGNOSIS — M8580 Other specified disorders of bone density and structure, unspecified site: Secondary | ICD-10-CM | POA: Diagnosis not present

## 2016-04-10 DIAGNOSIS — E559 Vitamin D deficiency, unspecified: Secondary | ICD-10-CM | POA: Diagnosis not present

## 2016-04-10 DIAGNOSIS — Z8041 Family history of malignant neoplasm of ovary: Secondary | ICD-10-CM

## 2016-04-10 DIAGNOSIS — Z0000 Encounter for general adult medical examination without abnormal findings: Secondary | ICD-10-CM

## 2016-04-10 DIAGNOSIS — Z1239 Encounter for other screening for malignant neoplasm of breast: Secondary | ICD-10-CM

## 2016-04-10 DIAGNOSIS — Z1211 Encounter for screening for malignant neoplasm of colon: Secondary | ICD-10-CM

## 2016-04-10 DIAGNOSIS — Z13220 Encounter for screening for lipoid disorders: Secondary | ICD-10-CM

## 2016-04-10 LAB — LIPID PANEL
CHOL/HDL RATIO: 2
Cholesterol: 174 mg/dL (ref 0–200)
HDL: 81 mg/dL (ref >39.00)
LDL CALC: 82 mg/dL (ref 0–99)
NONHDL: 93.02
Triglycerides: 54 mg/dL (ref 0.0–149.0)
VLDL: 10.8 mg/dL (ref 0.0–40.0)

## 2016-04-10 LAB — COMPREHENSIVE METABOLIC PANEL
ALT: 13 U/L (ref 0–35)
AST: 16 U/L (ref 0–37)
Albumin: 4.6 g/dL (ref 3.5–5.2)
Alkaline Phosphatase: 85 U/L (ref 39–117)
BUN: 16 mg/dL (ref 6–23)
CO2: 30 mEq/L (ref 19–32)
Calcium: 9.9 mg/dL (ref 8.4–10.5)
Chloride: 104 mEq/L (ref 96–112)
Creatinine, Ser: 0.65 mg/dL (ref 0.40–1.20)
GFR: 101.09 mL/min (ref >60.00)
GLUCOSE: 85 mg/dL (ref 70–99)
POTASSIUM: 4.6 meq/L (ref 3.5–5.1)
SODIUM: 140 meq/L (ref 135–145)
TOTAL PROTEIN: 7 g/dL (ref 6.0–8.3)
Total Bilirubin: 1.1 mg/dL (ref 0.2–1.2)

## 2016-04-10 LAB — CBC
HEMATOCRIT: 39.4 % (ref 36.0–46.0)
HEMOGLOBIN: 13.4 g/dL (ref 12.0–15.0)
MCHC: 34 g/dL (ref 30.0–36.0)
MCV: 93.6 fl (ref 78.0–100.0)
Platelets: 223 10*3/uL (ref 150.0–400.0)
RBC: 4.21 Mil/uL (ref 3.87–5.11)
RDW: 13.9 % (ref 11.5–15.5)
WBC: 3.7 10*3/uL — AB (ref 4.0–10.5)

## 2016-04-10 LAB — TSH: TSH: 0.9 u[IU]/mL (ref 0.35–4.50)

## 2016-04-10 LAB — VITAMIN D 25 HYDROXY (VIT D DEFICIENCY, FRACTURES): VITD: 28.38 ng/mL — ABNORMAL LOW (ref 30.00–100.00)

## 2016-04-10 LAB — HEMOGLOBIN A1C: HEMOGLOBIN A1C: 5.3 % (ref 4.6–6.5)

## 2016-04-10 MED ORDER — ERYTHROMYCIN 2 % EX GEL: Freq: Every day | CUTANEOUS | Status: DC

## 2016-04-10 MED ORDER — TOPROL XL 25 MG PO TB24: 25.0000 mg | ORAL_TABLET | Freq: Every day | ORAL | Status: DC

## 2016-04-10 NOTE — Patient Instructions (Signed)
It was great to see you today! I will be in touch with your labs I ordered a mammogram for you at the [facility]- if you wish to make your own appt call [phone]

## 2016-04-10 NOTE — Progress Notes (Signed)
[facility] [place] [facility] [address], [address] [place], [place] [address] [phone] [phone]  Date:  04/10/2016   Name:  Julien NordmannJanice Y Sumida   DOB:  December 14, 1961   MRN:  [id number]  PCP:  Abbe AmsterdamOPLAND,JESSICA, MD    Chief Complaint: Annual Exam   History of Present Illness:  Julien NordmannJanice Y Steinhardt is a 55 y.o. very pleasant female patient who presents with the following:  History of anxiety, borderline underweight Last labs in May of 2016 She has been a pt Dr. Chevis PrettyMezer but he is now retired.   Her last pap was last year, it was normal.  No recent abnl pap.  She is not SA right now.   She is feeling well, she is moving in with her dad so as to care for her. She is going to sell her home.  Her father has cancer and is not expected to live more than 6 months or so- he needs her to care for him.    History of osteopenia and low vitamin D- she took the rx supplement last year and is now on an OTC 1,000 IU dose She is fasting today for labs.    She would like to do a cologuard.   She is planning a beach trip next week. Admits that she has been tanning a little bit.   [place] and she is looking forward to the vacation Her mother had ovarian cancer so she does like to have the Ca-125 test periodically Wt Readings from Last 3 Encounters:  04/10/16 119 lb (53.978 kg)  11/08/15 120 lb (54.432 kg)  11/01/15 120 lb 6.4 oz (54.613 kg)    Patient Active Problem List   Diagnosis Date Noted  . Heart palpitations 04/24/2015  . Osteopenia 04/24/2015  . Mitral valve prolapse 01/20/2014  . Palpitations 01/20/2014    Past Medical History  Diagnosis Date  . Mitral valve prolapse   . Anxiety     Past Surgical History  Procedure Laterality Date  . Tubal ligation      Social History  Substance Use Topics  . Smoking status: Former Smoker    Types: Cigarettes  . Smokeless tobacco: Never Used     Comment: quit in 2009  . Alcohol Use: Yes     Family History  Problem Relation Age of Onset  . Ovarian cancer Mother   . Cancer Mother   . Lymphoma Father   . Heart disease Father   . Cancer Father   . Non-Hodgkin's lymphoma Father     Allergies  Allergen Reactions  . Codeine Nausea And Vomiting    Medication list has been reviewed and updated.  Current Outpatient Prescriptions on File Prior to Visit  Medication Sig Dispense Refill  . ALPRAZolam (XANAX) 0.25 MG tablet TAKE 1 TABLET BY MOUTH DAILY AS NEEDED 30 tablet 0  . cholecalciferol (VITAMIN D) 1000 units tablet Take 1,000 Units by mouth daily.    Marland Kitchen. ibuprofen (ADVIL,MOTRIN) 600 MG tablet Take 1 tablet (600 mg total) by mouth every 8 (eight) hours as needed. 30 tablet 0  . TOPROL XL 25 MG 24 hr tablet Take 1 tablet (25 mg total) by mouth daily. Brand only please 30 tablet 11   No current facility-administered medications on file prior to visit.    Review of Systems:  As per HPI- otherwise negative. No fainting or hypotension noted Not SA currently   Physical Examination: Filed Vitals:  04/10/16 0813  BP: 90/62  Pulse: 51  Temp: 97.9 F (36.6 C)   Filed Vitals:   04/10/16 0813  Height:  (1.651 m)  Weight: 119 lb (53.978 kg)   Body mass index is 19.8 kg/(m^2). Ideal Body Weight: Weight in (lb) to have BMI = 25: 149.9  GEN: WDWN, NAD, Non-toxic, A & O x 3, slim build, looks well HEENT: Atraumatic, Normocephalic. Neck supple. No masses, No LAD.  Bilateral TM wnl, oropharynx normal.  PEERL,EOMI.   Ears and Nose: No external deformity. CV: RRR, No M/G/R. No JVD. No thrill. No extra heart sounds. PULM: CTA B, no wheezes, crackles, rhonchi. No retractions. No resp. distress. No accessory muscle use. ABD: S, NT, ND, +BS. No rebound. No HSM. EXTR: No c/c/e NEURO Normal gait.  PSYCH: Normally interactive. Conversant. Not depressed or anxious appearing.  Calm demeanor.  Breast: normal exam, no masses/ dimpling/ discharge.  Left nipple is inverted- pt  reports this has been stable for several years and underwent full eval when it first appeared   BP Readings from Last 3 Encounters:  04/10/16 90/62  11/08/15 100/66  11/01/15 124/62   Pulse Readings from Last 3 Encounters:  04/10/16 51  11/08/15 71  11/01/15 66    Assessment and Plan: Physical exam  Osteopenia - Plan: Vitamin D, 25-hydroxy  Vitamin D deficiency - Plan: Vitamin D, 25-hydroxy  Family history of ovarian cancer - Plan: CA 125  Screening for thyroid disorder - Plan: TSH  Screening for diabetes mellitus - Plan: Comprehensive metabolic panel, Hemoglobin A1c  Screening for deficiency anemia - Plan: CBC  Screening for hyperlipidemia - Plan: Lipid panel  Screening for colon cancer  Screening for breast cancer - Plan: MM Digital Screening  Heart palpitations - Plan: TOPROL XL 25 MG 24 hr tablet  Labs as above Continue toprol for palpitations ordered mammo, cologuard ordered   Signed Abbe Amsterdam, MD

## 2016-04-10 NOTE — Progress Notes (Signed)
Pre visit review using our clinic tool,if applicable. No additional management support is needed unless otherwise documented below in the visit note.  

## 2016-04-11 LAB — CA 125: CA 125: 8 U/mL (ref <35)

## 2016-04-24 ENCOUNTER — Encounter: Payer: 59 | Admitting: Family Medicine

## 2016-05-05 DIAGNOSIS — Z1211 Encounter for screening for malignant neoplasm of colon: Secondary | ICD-10-CM | POA: Diagnosis not present

## 2016-05-05 DIAGNOSIS — Z1212 Encounter for screening for malignant neoplasm of rectum: Secondary | ICD-10-CM | POA: Diagnosis not present

## 2016-05-13 ENCOUNTER — Ambulatory Visit (HOSPITAL_BASED_OUTPATIENT_CLINIC_OR_DEPARTMENT_OTHER): Payer: BLUE CROSS/BLUE SHIELD

## 2016-05-23 ENCOUNTER — Encounter: Payer: Self-pay | Admitting: Family Medicine

## 2016-05-27 ENCOUNTER — Ambulatory Visit (HOSPITAL_BASED_OUTPATIENT_CLINIC_OR_DEPARTMENT_OTHER)
Admission: RE | Admit: 2016-05-27 | Discharge: 2016-05-27 | Disposition: A | Discharge status: Home / Self Care | Payer: BLUE CROSS/BLUE SHIELD | Source: Ambulatory Visit | Attending: Family Medicine | Admitting: Family Medicine

## 2016-05-27 DIAGNOSIS — Z1239 Encounter for other screening for malignant neoplasm of breast: Secondary | ICD-10-CM

## 2016-05-27 DIAGNOSIS — Z1231 Encounter for screening mammogram for malignant neoplasm of breast: Secondary | ICD-10-CM | POA: Insufficient documentation

## 2016-06-04 ENCOUNTER — Other Ambulatory Visit: Payer: Self-pay | Admitting: Family Medicine

## 2016-06-04 MED ORDER — ALPRAZOLAM 0.25 MG PO TABS: 0.2500 mg | ORAL_TABLET | Freq: Every day | ORAL | Status: DC | PRN

## 2016-09-03 DIAGNOSIS — Z23 Encounter for immunization: Secondary | ICD-10-CM | POA: Diagnosis not present

## 2016-09-10 DIAGNOSIS — Z681 Body mass index (BMI) 19 or less, adult: Secondary | ICD-10-CM | POA: Diagnosis not present

## 2016-09-10 DIAGNOSIS — Z01419 Encounter for gynecological examination (general) (routine) without abnormal findings: Secondary | ICD-10-CM | POA: Diagnosis not present

## 2016-09-10 DIAGNOSIS — N952 Postmenopausal atrophic vaginitis: Secondary | ICD-10-CM | POA: Diagnosis not present

## 2016-12-02 ENCOUNTER — Other Ambulatory Visit: Payer: Self-pay | Admitting: Family Medicine

## 2016-12-02 NOTE — Telephone Encounter (Signed)
Refill request for ALPRAZolam 0.25 MG TABLET. Last office visit 03/2016 and last refill 05/2016. Is it ok to refill? Please advise.

## 2016-12-18 ENCOUNTER — Encounter: Payer: Self-pay | Admitting: Family Medicine

## 2016-12-18 ENCOUNTER — Ambulatory Visit (INDEPENDENT_AMBULATORY_CARE_PROVIDER_SITE_OTHER): Payer: BLUE CROSS/BLUE SHIELD | Admitting: Family Medicine

## 2016-12-18 VITALS — BP 92/60 | HR 70 | Temp 97.8°F | Resp 16 | Ht 65.0 in | Wt 118.4 lb

## 2016-12-18 DIAGNOSIS — R51 Headache: Secondary | ICD-10-CM | POA: Diagnosis not present

## 2016-12-18 DIAGNOSIS — R519 Headache, unspecified: Secondary | ICD-10-CM

## 2016-12-18 DIAGNOSIS — J0111 Acute recurrent frontal sinusitis: Secondary | ICD-10-CM

## 2016-12-18 MED ORDER — AMOXICILLIN 500 MG PO CAPS: 1000.0000 mg | ORAL_CAPSULE | Freq: Two times a day (BID) | ORAL | 0 refills | Status: DC

## 2016-12-18 NOTE — Progress Notes (Signed)
[facility] [address], [address] [place], [place] [address] [phone] [phone]  Date:  12/18/2016   Name:  Michele NordmannJanice Y Jacobs   DOB:  04/16/1961   MRN:  [id number]  PCP:  Abbe AmsterdamOPLAND,Kartik Fernando, MD    Chief Complaint: Sinusitis (started chrismas day, headache, sore throat, ear pain both, sinus pressure.  has been taking mucinex)   History of Present Illness:  Michele NordmannJanice Y Jacobs is a 55 y.o. very pleasant female patient who presents with the following:  History of osteopenia, palpitations. Here today with compliant of possible sinus infection She tends to have hypotension.  She has osteopenia and borderline underweight Body mass index is 19.7 kg/m. She notes that she has had "a cold for like 2 weeks, I started to get better but then 2 days ago my head and my face started to hurt."   No cough, ST she thinks due to PND Her ears are tender Her headache is in her face.  She is not sure if it is worse when she bends down No GI symptoms Her father has been sick as well.   She has noted chills, no fever or body aches however She is using mucinex.  Took one ibuprofen this am onlyamo She has noted urinary frequency last night only- no hematuria or dysuria.    She is s/p menopause Allergic to codeine, otherwise NKDA  Her BP is always on the low side She is not SA  BP Readings from Last 3 Encounters:  12/18/16 (!) 101/55  04/10/16 90/62  11/08/15 100/66     Patient Active Problem List   Diagnosis Date Noted  . Heart palpitations 04/24/2015  . Osteopenia 04/24/2015  . Mitral valve prolapse 01/20/2014  . Palpitations 01/20/2014    Past Medical History:  Diagnosis Date  . Anxiety   . Mitral valve prolapse     Past Surgical History:  Procedure Laterality Date  . TUBAL LIGATION      Social History  Substance Use Topics  . Smoking status: Former Smoker    Types: Cigarettes  . Smokeless tobacco: Never Used     Comment: quit in 2009   . Alcohol use Yes    Family History  Problem Relation Age of Onset  . Ovarian cancer Mother   . Cancer Mother   . Lymphoma Father   . Heart disease Father   . Cancer Father   . Non-Hodgkin's lymphoma Father     Allergies  Allergen Reactions  . Codeine Nausea And Vomiting    Medication list has been reviewed and updated.  Current Outpatient Prescriptions on File Prior to Visit  Medication Sig Dispense Refill  . ALPRAZolam (XANAX) 0.25 MG tablet TAKE ONE TABLET BY MOUTH DAILY AS NEEDED FOR ANXIETY 30 tablet 0  . cholecalciferol (VITAMIN D) 1000 units tablet Take 1,000 Units by mouth daily.    Marland Kitchen. erythromycin with ethanol (EMGEL) 2 % gel Apply topically daily. 30 g 0  . TOPROL XL 25 MG 24 hr tablet Take 1 tablet (25 mg total) by mouth daily. Brand only please 30 tablet 11   No current facility-administered medications on file prior to visit.     Review of Systems:  As per HPI- otherwise negative. No fever, rash or GI symptoms    Physical Examination: Vitals:   12/18/16 1011 12/18/16 1017  BP: (!) 85/61 (!) 101/55  Pulse: 75 70  Resp: 16   Temp: 97.8 F (36.6 C)  Vitals:   12/18/16 1011  Weight: 118 lb 6.4 oz (53.7 kg)  Height: 5\' 5"  (1.651 m)   Body mass index is 19.7 kg/m. Ideal Body Weight: Weight in (lb) to have BMI = 25: 149.9  GEN: WDWN, NAD, Non-toxic, A & O x 3, slim build, looks well otherwise HEENT: Atraumatic, Normocephalic. Neck supple. No masses, No LAD.  Bilateral TM wnl, oropharynx normal.  PEERL,EOMI.   Sinuses are tender to percussion, purulent nasal discharge is present.  No meningismus  Ears and Nose: No external deformity. CV: RRR, No M/G/R. No JVD. No thrill. No extra heart sounds. PULM: CTA B, no wheezes, crackles, rhonchi. No retractions. No resp. distress. No accessory muscle use. EXTR: No c/c/e NEURO Normal gait.  PSYCH: Normally interactive. Conversant. Not depressed or anxious appearing.  Calm demeanor.    Assessment and  Plan: Acute recurrent frontal sinusitis - Plan: amoxicillin (AMOXIL) 500 MG capsule  Facial pain - Plan: amoxicillin (AMOXIL) 500 MG capsule  Here today with about 2 weeks of sx that started to get better and then worsened again, sinus pain and pressure.  Will treat for a sinus infection with amoxicillin Discussed symptomatic treatment with OTC meds as well She will let me know if not feeling better soon Low BP is baseline for her  Signed Abbe AmsterdamJessica Rilie Glanz, MD

## 2016-12-18 NOTE — Patient Instructions (Signed)
It was good to see you today- I am sorry that you have a sinus infection We are going to treat you with amoxicillin (antibiotic) I suspect that your mucinex cold and flu contains acetaminophen.  It is ok to combine ibuprofen with this medication- you can take up to 800 mg three times a day for sinus pain Please let me know if you are not feeling better in the next few days- Sooner if worse.

## 2016-12-18 NOTE — Progress Notes (Signed)
Pre visit review using our clinic review tool, if applicable. No additional management support is needed unless otherwise documented below in the visit note. 

## 2016-12-24 ENCOUNTER — Telehealth: Payer: Self-pay | Admitting: Family Medicine

## 2016-12-24 ENCOUNTER — Encounter: Payer: Self-pay | Admitting: Family Medicine

## 2016-12-24 NOTE — Telephone Encounter (Signed)
°  Relation to pt: SELF Call back [phone] Pharmacy: [facility]  Reason for call:  Pt was seen on 12/18/16, Dr. Patsy Lageropland treated her for a sinus infection, pt states she is not any worse but is not better either, states she will take the last dose of meds on tomorrow, would like to know what she should do. States Dr. Patsy Lageropland informed her to let her know if she didn't get any better.

## 2016-12-26 MED ORDER — PREDNISONE 20 MG PO TABS: ORAL_TABLET | ORAL | 0 refills | Status: DC

## 2016-12-31 ENCOUNTER — Encounter: Payer: Self-pay | Admitting: Family

## 2016-12-31 ENCOUNTER — Telehealth: Payer: Self-pay | Admitting: Family

## 2016-12-31 ENCOUNTER — Ambulatory Visit (HOSPITAL_BASED_OUTPATIENT_CLINIC_OR_DEPARTMENT_OTHER)
Admission: RE | Admit: 2016-12-31 | Discharge: 2016-12-31 | Disposition: A | Discharge status: Home / Self Care | Payer: BLUE CROSS/BLUE SHIELD | Source: Ambulatory Visit | Attending: Family | Admitting: Family

## 2016-12-31 ENCOUNTER — Ambulatory Visit (INDEPENDENT_AMBULATORY_CARE_PROVIDER_SITE_OTHER): Payer: BLUE CROSS/BLUE SHIELD | Admitting: Family

## 2016-12-31 VITALS — BP 108/70 | HR 80 | Temp 98.4°F | Resp 16 | Ht 65.0 in | Wt 119.4 lb

## 2016-12-31 DIAGNOSIS — R059 Cough, unspecified: Secondary | ICD-10-CM

## 2016-12-31 DIAGNOSIS — R05 Cough: Secondary | ICD-10-CM

## 2016-12-31 MED ORDER — BENZONATATE 100 MG PO CAPS: 100.0000 mg | ORAL_CAPSULE | Freq: Three times a day (TID) | ORAL | 0 refills | Status: DC | PRN

## 2016-12-31 NOTE — Telephone Encounter (Signed)
Please contact patient and let her know that her chest x ray is negative for pneumonia. I suspect that her symptoms are due to a viral illness that she picked up in the last few days. She should begin tessalon. Call if new/worsening symptoms, fever >101 or if not improved in 3 days.

## 2016-12-31 NOTE — Progress Notes (Addendum)
Subjective:    Patient ID: Michele NordmannJanice Y Jacobs, female    DOB: 1961-03-07, 56 y.o.   MRN: [id number]  HPI  [name] is a 56 yr old female who presents today with chief complaint of cough. Cough is productive of brown sputum.  Patient was initially evaluated on 12/18/16 for sinus infection. She was placed on amoxicillin at that time. Symptoms did not improve.  She called back and was given rx for prednisone.  Reports that her sinus symptoms improved but not is has "moved to my chest." Reports some chills and low grade intermittent fever- Tmax 100.3.   Review of Systems See HPI  Past Medical History:  Diagnosis Date  . Anxiety   . Mitral valve prolapse      Social History   Social History  . Marital status: Single    Spouse name: N/A  . Number of children: N/A  . Years of education: N/A   Occupational History  . bookkeeper    Social History Main Topics  . Smoking status: Former Smoker    Types: Cigarettes  . Smokeless tobacco: Never Used     Comment: quit in 2009  . Alcohol use Yes  . Drug use: No  . Sexual activity: No   Other Topics Concern  . Not on file   Social History Narrative   Lives alone;   No children   Bookkeeper    Past Surgical History:  Procedure Laterality Date  . TUBAL LIGATION      Family History  Problem Relation Age of Onset  . Ovarian cancer Mother   . Cancer Mother   . Lymphoma Father   . Heart disease Father   . Cancer Father   . Non-Hodgkin's lymphoma Father     Allergies  Allergen Reactions  . Codeine Nausea And Vomiting    Current Outpatient Prescriptions on File Prior to Visit  Medication Sig Dispense Refill  . ALPRAZolam (XANAX) 0.25 MG tablet TAKE ONE TABLET BY MOUTH DAILY AS NEEDED FOR ANXIETY 30 tablet 0  . cholecalciferol (VITAMIN D) 1000 units tablet Take 1,000 Units by mouth daily.    Marland Kitchen. erythromycin with ethanol (EMGEL) 2 % gel Apply topically daily. 30 g 0  . omeprazole (PRILOSEC) 20 MG capsule Take 20 mg by mouth  as needed.     . TOPROL XL 25 MG 24 hr tablet Take 1 tablet (25 mg total) by mouth daily. Brand only please 30 tablet 11   No current facility-administered medications on file prior to visit.     BP 108/70 (BP Location: Right Arm, Cuff Size: Normal)   Pulse 80   Temp 98.4 F (36.9 C) (Oral)   Resp 16   Ht 5\' 5"  (1.651 m)   Wt 119 lb 6.4 oz (54.2 kg)   SpO2 100% Comment: room air  BMI 19.87 kg/m       Objective:   Physical Exam  Constitutional: She is oriented to person, place, and time. She appears well-developed and well-nourished.  HENT:  Head: Normocephalic and atraumatic.  Right Ear: Tympanic membrane and ear canal normal.  Left Ear: Tympanic membrane and ear canal normal.  Nose: Right sinus exhibits no maxillary sinus tenderness and no frontal sinus tenderness. Left sinus exhibits no frontal sinus tenderness.  Mouth/Throat: No oropharyngeal exudate, posterior oropharyngeal edema or posterior oropharyngeal erythema.  Cardiovascular: Normal rate, regular rhythm and normal heart sounds.   No murmur heard. Pulmonary/Chest: Effort normal and breath sounds normal. No respiratory distress. She  has no wheezes.  Musculoskeletal: She exhibits no edema.  Neurological: She is alert and oriented to person, place, and time.  Psychiatric: She has a normal mood and affect. Her behavior is normal. Judgment and thought content normal.          Assessment & Plan:  Cough- need to rule out PNA.  Will rx with tessalon prn and refer for CXR.  If PNA will need additional antibiotic therapy.  If CXR is negative, then would suspect viral illness.    Addendum:   chest x ray is negative for pneumonia. I suspect that her recent worsening symptoms are due to a viral illness that she picked up in the last few days. Pt will be advised as follow: She should begin tessalon. Call if new/worsening symptoms, fever >101 or if not improved in 3 days.

## 2016-12-31 NOTE — Patient Instructions (Signed)
Please complete chest x ray on the first floor. We will contact you with your results. You may use tessalon as needed for cough. Call if new/worsening symptoms or if symptoms are not improved in 3 days.

## 2016-12-31 NOTE — Progress Notes (Signed)
Pre visit review using our clinic review tool, if applicable. No additional management support is needed unless otherwise documented below in the visit note. 

## 2016-12-31 NOTE — Telephone Encounter (Signed)
Notified pt and she voices understanding. 

## 2017-04-29 NOTE — Progress Notes (Signed)
[facility] at [facility] [address], [address] [place], [place] [address] [phone]  Date:  04/30/2017   Name:  Michele Jacobs   DOB:  1961-07-07   MRN:  [id number]  PCP:  Michele Cables, MD    Chief Complaint: No chief complaint on file.   History of Present Illness:  Michele Jacobs is a 56 y.o. very pleasant female patient who presents with the following:  Here today for a CPE History of anxiety, osteopenia, palpitations Last labs about one year ago- looked good at that time dexa done 2 years ago- may want to update now.  She will do this per her GYN  She has moved in with her dad- he is still with [place] but is getting weaker. She is fasting today for labs  She does still go to [facility] and sees Wardell Honour since Dr. Chevis Pretty retired She had been a cardiology pt- she is on metoprolol for her palpitations.  This works well for her- states that she has to have the name brand.  Needs refill today  Reviewed NCCSR:  Filled 30 xanax on 12/04/16, no other entries   Partial HPI from her CPE last year:  History of anxiety, borderline underweight Last labs in May of 2017 She has been a pt Dr. Chevis Pretty but he is now retired.   Her last pap was last year, it was normal.  No recent abnl pap.  She is not SA right now.   She is feeling well, she is moving in with her dad so as to care for her. She is going to sell her home.  Her father has cancer and is not expected to live more than 6 months or so- he needs her to care for him.    History of osteopenia and low vitamin D- she took the rx supplement last year and is now on an OTC 1,000 IU dose  Former smoker- quit about 10 years ago She exercises by running a mile on a treadmill daily- no CP or SOB with exercise Her anxiety is under control.  sometimes will use her xanax when her dad is being more irritable  No concern about any skin lesions No CP or SOB  Wt Readings from Last 3 Encounters:   04/30/17 117 lb 6.4 oz (53.3 kg)  12/31/16 119 lb 6.4 oz (54.2 kg)  12/18/16 118 lb 6.4 oz (53.7 kg)   BP Readings from Last 3 Encounters:  04/30/17 110/62  12/31/16 108/70  12/18/16 92/60      Patient Active Problem List   Diagnosis Date Noted  . Heart palpitations 04/24/2015  . Osteopenia 04/24/2015  . Mitral valve prolapse 01/20/2014  . Palpitations 01/20/2014    Past Medical History:  Diagnosis Date  . Anxiety   . Mitral valve prolapse     Past Surgical History:  Procedure Laterality Date  . TUBAL LIGATION      Social History  Substance Use Topics  . Smoking status: Former Smoker    Types: Cigarettes  . Smokeless tobacco: Never Used     Comment: quit in 2009  . Alcohol use Yes    Family History  Problem Relation Age of Onset  . Ovarian cancer Mother   . Cancer Mother   . Lymphoma Father   . Heart disease Father   . Cancer Father   . Non-Hodgkin's lymphoma Father     Allergies  Allergen Reactions  . Codeine  Nausea And Vomiting    Medication list has been reviewed and updated.  Current Outpatient Prescriptions on File Prior to Visit  Medication Sig Dispense Refill  . ALPRAZolam (XANAX) 0.25 MG tablet TAKE ONE TABLET BY MOUTH DAILY AS NEEDED FOR ANXIETY 30 tablet 0  . cholecalciferol (VITAMIN D) 1000 units tablet Take 1,000 Units by mouth daily.    Marland Kitchen. erythromycin with ethanol (EMGEL) 2 % gel Apply topically daily. 30 g 0  . omeprazole (PRILOSEC) 20 MG capsule Take 20 mg by mouth as needed.     . TOPROL XL 25 MG 24 hr tablet Take 1 tablet (25 mg total) by mouth daily. Brand only please 30 tablet 11   No current facility-administered medications on file prior to visit.     Review of Systems:  As per HPI- otherwise negative.   Physical Examination: Vitals:   04/30/17 0919  BP: 110/62  Pulse: (!) 56  Temp: 97.8 F (36.6 C)   Vitals:   04/30/17 0919  Weight: 117 lb 6.4 oz (53.3 kg)  Height: 5' 4.75" (1.645 m)   Body mass index is  19.69 kg/m. Ideal Body Weight: Weight in (lb) to have BMI = 25: 148.8  GEN: WDWN, NAD, Non-toxic, A & O x 3, thin but not underweight, looks well today HEENT: Atraumatic, Normocephalic. Neck supple. No masses, No LAD.  Bilateral TM wnl, oropharynx normal.  PEERL,EOMI.  Ears and Nose: No external deformity. CV: RRR, No M/G/R. No JVD. No thrill. No extra heart sounds. PULM: CTA B, no wheezes, crackles, rhonchi. No retractions. No resp. distress. No accessory muscle use. ABD: S, NT, ND. No rebound. No HSM. EXTR: No c/c/e NEURO Normal gait.  PSYCH: Normally interactive. Conversant. Not depressed or anxious appearing.  Calm demeanor.   EKG: mild sinus brady with rate of 54.  She does have an SRS' in V1 which is new since 2014 but otherwise no change.  No concerning findings  Assessment and Plan: Physical exam  Screening for hyperlipidemia - Plan: Lipid panel  Vitamin D deficiency - Plan: Vitamin D (25 hydroxy)  Osteopenia, unspecified location - Plan: TSH, Vitamin D (25 hydroxy)  GAD (generalized anxiety disorder) - Plan: ALPRAZolam (XANAX) 0.25 MG tablet  Screening for thyroid disorder - Plan: TSH  Palpitations - Plan: EKG 12-Lead  Screening for deficiency anemia - Plan: CBC  Screening for diabetes mellitus - Plan: Comprehensive metabolic panel, Hemoglobin A1c  Heart palpitations - Plan: TOPROL XL 25 MG 24 hr tablet  Gastroesophageal reflux disease without esophagitis - Plan: omeprazole (PRILOSEC) 20 MG capsule  CPE today Overall doing great.  She will get a bone density per her GYN office when she has her mammo this year Will plan further follow- up pending labs.   Signed Abbe AmsterdamJessica [name], MD

## 2017-04-30 ENCOUNTER — Ambulatory Visit (INDEPENDENT_AMBULATORY_CARE_PROVIDER_SITE_OTHER): Payer: BLUE CROSS/BLUE SHIELD | Admitting: Family Medicine

## 2017-04-30 VITALS — BP 110/62 | HR 56 | Temp 97.8°F | Ht 64.75 in | Wt 117.4 lb

## 2017-04-30 DIAGNOSIS — Z0000 Encounter for general adult medical examination without abnormal findings: Secondary | ICD-10-CM

## 2017-04-30 DIAGNOSIS — M8580 Other specified disorders of bone density and structure, unspecified site: Secondary | ICD-10-CM | POA: Diagnosis not present

## 2017-04-30 DIAGNOSIS — K219 Gastro-esophageal reflux disease without esophagitis: Secondary | ICD-10-CM

## 2017-04-30 DIAGNOSIS — Z131 Encounter for screening for diabetes mellitus: Secondary | ICD-10-CM | POA: Diagnosis not present

## 2017-04-30 DIAGNOSIS — F411 Generalized anxiety disorder: Secondary | ICD-10-CM

## 2017-04-30 DIAGNOSIS — Z1329 Encounter for screening for other suspected endocrine disorder: Secondary | ICD-10-CM

## 2017-04-30 DIAGNOSIS — Z130 Encounter for screening for diseases of the blood and blood-forming organs and certain disorders involving the immune mechanism: Secondary | ICD-10-CM | POA: Diagnosis not present

## 2017-04-30 DIAGNOSIS — E559 Vitamin D deficiency, unspecified: Secondary | ICD-10-CM

## 2017-04-30 DIAGNOSIS — Z13220 Encounter for screening for lipoid disorders: Secondary | ICD-10-CM

## 2017-04-30 DIAGNOSIS — R002 Palpitations: Secondary | ICD-10-CM | POA: Diagnosis not present

## 2017-04-30 LAB — LIPID PANEL
Cholesterol: 182 mg/dL (ref 0–200)
HDL: 86.2 mg/dL (ref >39.00)
LDL Cholesterol: 83 mg/dL (ref 0–99)
NonHDL: 95.97
TRIGLYCERIDES: 65 mg/dL (ref 0.0–149.0)
Total CHOL/HDL Ratio: 2
VLDL: 13 mg/dL (ref 0.0–40.0)

## 2017-04-30 LAB — CBC
HEMATOCRIT: 40.2 % (ref 36.0–46.0)
HEMOGLOBIN: 13.5 g/dL (ref 12.0–15.0)
MCHC: 33.6 g/dL (ref 30.0–36.0)
MCV: 92.1 fl (ref 78.0–100.0)
Platelets: 211 10*3/uL (ref 150.0–400.0)
RBC: 4.36 Mil/uL (ref 3.87–5.11)
RDW: 13.2 % (ref 11.5–15.5)
WBC: 4.8 10*3/uL (ref 4.0–10.5)

## 2017-04-30 LAB — COMPREHENSIVE METABOLIC PANEL
ALK PHOS: 79 U/L (ref 39–117)
ALT: 13 U/L (ref 0–35)
AST: 16 U/L (ref 0–37)
Albumin: 4.7 g/dL (ref 3.5–5.2)
BUN: 15 mg/dL (ref 6–23)
CO2: 26 mEq/L (ref 19–32)
Calcium: 9.8 mg/dL (ref 8.4–10.5)
Chloride: 104 mEq/L (ref 96–112)
Creatinine, Ser: 0.71 mg/dL (ref 0.40–1.20)
GFR: 90.93 mL/min (ref >60.00)
Glucose, Bld: 72 mg/dL (ref 70–99)
POTASSIUM: 3.8 meq/L (ref 3.5–5.1)
Sodium: 139 mEq/L (ref 135–145)
TOTAL PROTEIN: 7 g/dL (ref 6.0–8.3)
Total Bilirubin: 1.6 mg/dL — ABNORMAL HIGH (ref 0.2–1.2)

## 2017-04-30 LAB — HEMOGLOBIN A1C: HEMOGLOBIN A1C: 5.5 % (ref 4.6–6.5)

## 2017-04-30 LAB — VITAMIN D 25 HYDROXY (VIT D DEFICIENCY, FRACTURES): VITD: 35.35 ng/mL (ref 30.00–100.00)

## 2017-04-30 LAB — TSH: TSH: 1.34 u[IU]/mL (ref 0.35–4.50)

## 2017-04-30 MED ORDER — TOPROL XL 25 MG PO TB24: 25.0000 mg | ORAL_TABLET | Freq: Every day | ORAL | 3 refills | Status: DC

## 2017-04-30 MED ORDER — ALPRAZOLAM 0.25 MG PO TABS: ORAL_TABLET | ORAL | 0 refills | Status: DC

## 2017-04-30 MED ORDER — OMEPRAZOLE 20 MG PO CPDR
20.0000 mg | DELAYED_RELEASE_CAPSULE | ORAL | 3 refills | Status: DC | PRN
Start: 2017-04-30 — End: 2017-10-03

## 2017-04-30 NOTE — Patient Instructions (Signed)
It was a pleasure Health Maintenance for Postmenopausal Women Menopause is a normal process in which your reproductive ability comes to an end. This process happens gradually over a span of months to years, usually between the ages of 69 and 23. Menopause is complete when you have missed 12 consecutive menstrual periods. It is important to talk with your health care provider about some of the most common conditions that affect postmenopausal women, such as heart disease, cancer, and bone loss (osteoporosis). Adopting a healthy lifestyle and getting preventive care can help to promote your health and wellness. Those actions can also lower your chances of developing some of these common conditions. What should I know about menopause? During menopause, you may experience a number of symptoms, such as:  Moderate-to-severe hot flashes.  Night sweats.  Decrease in sex drive.  Mood swings.  Headaches.  Tiredness.  Irritability.  Memory problems.  Insomnia. Choosing to treat or not to treat menopausal changes is an individual decision that you make with your health care provider. What should I know about hormone replacement therapy and supplements? Hormone therapy products are effective for treating symptoms that are associated with menopause, such as hot flashes and night sweats. Hormone replacement carries certain risks, especially as you become older. If you are thinking about using estrogen or estrogen with progestin treatments, discuss the benefits and risks with your health care provider. What should I know about heart disease and stroke? Heart disease, heart attack, and stroke become more likely as you age. This may be due, in part, to the hormonal changes that your body experiences during menopause. These can affect how your body processes dietary fats, triglycerides, and cholesterol. Heart attack and stroke are both medical emergencies. There are many things that you can do to help  prevent heart disease and stroke:  Have your blood pressure checked at least every 1-2 years. High blood pressure causes heart disease and increases the risk of stroke.  If you are 10-69 years old, ask your health care provider if you should take aspirin to prevent a heart attack or a stroke.  Do not use any tobacco products, including cigarettes, chewing tobacco, or electronic cigarettes. If you need help quitting, ask your health care provider.  It is important to eat a healthy diet and maintain a healthy weight.  Be sure to include plenty of vegetables, fruits, low-fat dairy products, and lean protein.  Avoid eating foods that are high in solid fats, added sugars, or salt (sodium).  Get regular exercise. This is one of the most important things that you can do for your health.  Try to exercise for at least 150 minutes each week. The type of exercise that you do should increase your heart rate and make you sweat. This is known as moderate-intensity exercise.  Try to do strengthening exercises at least twice each week. Do these in addition to the moderate-intensity exercise.  Know your numbers.Ask your health care provider to check your cholesterol and your blood glucose. Continue to have your blood tested as directed by your health care provider. What should I know about cancer screening? There are several types of cancer. Take the following steps to reduce your risk and to catch any cancer development as early as possible. Breast Cancer  Practice breast self-awareness.  This means understanding how your breasts normally appear and feel.  It also means doing regular breast self-exams. Let your health care provider know about any changes, no matter how small.  If you  are 12 or older, have a clinician do a breast exam (clinical breast exam or CBE) every year. Depending on your age, family history, and medical history, it may be recommended that you also have a yearly breast X-ray  (mammogram).  If you have a family history of breast cancer, talk with your health care provider about genetic screening.  If you are at high risk for breast cancer, talk with your health care provider about having an MRI and a mammogram every year.  Breast cancer (BRCA) gene test is recommended for women who have family members with BRCA-related cancers. Results of the assessment will determine the need for genetic counseling and BRCA1 and for BRCA2 testing. BRCA-related cancers include these types:  Breast. This occurs in males or females.  Ovarian.  Tubal. This may also be called fallopian tube cancer.  Cancer of the abdominal or pelvic lining (peritoneal cancer).  Prostate.  Pancreatic. Cervical, Uterine, and Ovarian Cancer  Your health care provider may recommend that you be screened regularly for cancer of the pelvic organs. These include your ovaries, uterus, and vagina. This screening involves a pelvic exam, which includes checking for microscopic changes to the surface of your cervix (Pap test).  For women ages 21-65, health care providers may recommend a pelvic exam and a Pap test every three years. For women ages 45-65, they may recommend the Pap test and pelvic exam, combined with testing for human papilloma virus (HPV), every five years. Some types of HPV increase your risk of cervical cancer. Testing for HPV may also be done on women of any age who have unclear Pap test results.  Other health care providers may not recommend any screening for nonpregnant women who are considered low risk for pelvic cancer and have no symptoms. Ask your health care provider if a screening pelvic exam is right for you.  If you have had past treatment for cervical cancer or a condition that could lead to cancer, you need Pap tests and screening for cancer for at least 20 years after your treatment. If Pap tests have been discontinued for you, your risk factors (such as having a new sexual  partner) need to be reassessed to determine if you should start having screenings again. Some women have medical problems that increase the chance of getting cervical cancer. In these cases, your health care provider may recommend that you have screening and Pap tests more often.  If you have a family history of uterine cancer or ovarian cancer, talk with your health care provider about genetic screening.  If you have vaginal bleeding after reaching menopause, tell your health care provider.  There are currently no reliable tests available to screen for ovarian cancer. Lung Cancer  Lung cancer screening is recommended for adults 74-68 years old who are at high risk for lung cancer because of a history of smoking. A yearly low-dose CT scan of the lungs is recommended if you:  Currently smoke.  Have a history of at least 30 pack-years of smoking and you currently smoke or have quit within the past 15 years. A pack-year is smoking an average of one pack of cigarettes per day for one year. Yearly screening should:  Continue until it has been 15 years since you quit.  Stop if you develop a health problem that would prevent you from having lung cancer treatment. Colorectal Cancer  This type of cancer can be detected and can often be prevented.  Routine colorectal cancer screening usually begins at  age 72 and continues through age 95.  If you have risk factors for colon cancer, your health care provider may recommend that you be screened at an earlier age.  If you have a family history of colorectal cancer, talk with your health care provider about genetic screening.  Your health care provider may also recommend using home test kits to check for hidden blood in your stool.  A small camera at the end of a tube can be used to examine your colon directly (sigmoidoscopy or colonoscopy). This is done to check for the earliest forms of colorectal cancer.  Direct examination of the colon should be  repeated every 5-10 years until age 62. However, if early forms of precancerous polyps or small growths are found or if you have a family history or genetic risk for colorectal cancer, you may need to be screened more often. Skin Cancer  Check your skin from head to toe regularly.  Monitor any moles. Be sure to tell your health care provider:  About any new moles or changes in moles, especially if there is a change in a mole's shape or color.  If you have a mole that is larger than the size of a pencil eraser.  If any of your family members has a history of skin cancer, especially at a young age, talk with your health care provider about genetic screening.  Always use sunscreen. Apply sunscreen liberally and repeatedly throughout the day.  Whenever you are outside, protect yourself by wearing long sleeves, pants, a wide-brimmed hat, and sunglasses. What should I know about osteoporosis? Osteoporosis is a condition in which bone destruction happens more quickly than new bone creation. After menopause, you may be at an increased risk for osteoporosis. To help prevent osteoporosis or the bone fractures that can happen because of osteoporosis, the following is recommended:  If you are 11-41 years old, get at least 1,000 mg of calcium and at least 600 mg of vitamin D per day.  If you are older than age 52 but younger than age 1, get at least 1,200 mg of calcium and at least 600 mg of vitamin D per day.  If you are older than age 76, get at least 1,200 mg of calcium and at least 800 mg of vitamin D per day. Smoking and excessive alcohol intake increase the risk of osteoporosis. Eat foods that are rich in calcium and vitamin D, and do weight-bearing exercises several times each week as directed by your health care provider. What should I know about how menopause affects my mental health? Depression may occur at any age, but it is more common as you become older. Common symptoms of depression  include:  Low or sad mood.  Changes in sleep patterns.  Changes in appetite or eating patterns.  Feeling an overall lack of motivation or enjoyment of activities that you previously enjoyed.  Frequent crying spells. Talk with your health care provider if you think that you are experiencing depression. What should I know about immunizations? It is important that you get and maintain your immunizations. These include:  Tetanus, diphtheria, and pertussis (Tdap) booster vaccine.  Influenza every year before the flu season begins.  Pneumonia vaccine.  Shingles vaccine. Your health care provider may also recommend other immunizations. This information is not intended to replace advice given to you by your health care provider. Make sure you discuss any questions you have with your health care provider. Document Released: 01/31/2006 Document Revised: 06/28/2016 Document Reviewed:  09/12/2015 Elsevier Interactive Patient Education  2017 [employer].  to see you today- take care and I will be in touch with your labs Remember your sunscreen!   Please have a bone density this year per your GYN office.  Take care

## 2017-07-14 ENCOUNTER — Telehealth: Payer: Self-pay | Admitting: Family Medicine

## 2017-07-14 NOTE — Telephone Encounter (Signed)
Pt says that she is having the same sinus issues that she was having before. She said that she also is having burning in her throat   Pt would like to have [facility] [address]  Pt currently have apt scheduled with PCP on 07/23/17.   Informed pt that PCP is currently out of the office. Pt declined seeing anyone else on her behalf.

## 2017-07-15 DIAGNOSIS — H9203 Otalgia, bilateral: Secondary | ICD-10-CM | POA: Diagnosis not present

## 2017-07-15 DIAGNOSIS — R51 Headache: Secondary | ICD-10-CM | POA: Diagnosis not present

## 2017-07-15 DIAGNOSIS — K219 Gastro-esophageal reflux disease without esophagitis: Secondary | ICD-10-CM | POA: Insufficient documentation

## 2017-07-15 DIAGNOSIS — K146 Glossodynia: Secondary | ICD-10-CM | POA: Insufficient documentation

## 2017-07-15 DIAGNOSIS — Z87891 Personal history of nicotine dependence: Secondary | ICD-10-CM | POA: Diagnosis not present

## 2017-07-15 HISTORY — DX: Gastro-esophageal reflux disease without esophagitis: K21.9

## 2017-07-15 HISTORY — DX: Glossodynia: K14.6

## 2017-07-18 ENCOUNTER — Other Ambulatory Visit: Payer: Self-pay | Admitting: Emergency Medicine

## 2017-07-18 DIAGNOSIS — R070 Pain in throat: Secondary | ICD-10-CM

## 2017-07-18 NOTE — Telephone Encounter (Signed)
Referral sent per pt request

## 2017-07-23 ENCOUNTER — Ambulatory Visit: Payer: Self-pay | Admitting: Family Medicine

## 2017-09-24 DIAGNOSIS — Z23 Encounter for immunization: Secondary | ICD-10-CM | POA: Diagnosis not present

## 2017-09-30 DIAGNOSIS — Z1231 Encounter for screening mammogram for malignant neoplasm of breast: Secondary | ICD-10-CM | POA: Diagnosis not present

## 2017-09-30 DIAGNOSIS — Z803 Family history of malignant neoplasm of breast: Secondary | ICD-10-CM | POA: Diagnosis not present

## 2017-09-30 DIAGNOSIS — Z681 Body mass index (BMI) 19 or less, adult: Secondary | ICD-10-CM | POA: Diagnosis not present

## 2017-09-30 DIAGNOSIS — Z8041 Family history of malignant neoplasm of ovary: Secondary | ICD-10-CM | POA: Diagnosis not present

## 2017-09-30 DIAGNOSIS — Z01419 Encounter for gynecological examination (general) (routine) without abnormal findings: Secondary | ICD-10-CM | POA: Diagnosis not present

## 2017-10-03 ENCOUNTER — Ambulatory Visit (INDEPENDENT_AMBULATORY_CARE_PROVIDER_SITE_OTHER): Payer: BLUE CROSS/BLUE SHIELD | Admitting: Family

## 2017-10-03 ENCOUNTER — Encounter: Payer: Self-pay | Admitting: Family

## 2017-10-03 VITALS — BP 99/61 | HR 69 | Temp 98.0°F | Resp 16 | Ht 65.0 in | Wt 120.8 lb

## 2017-10-03 DIAGNOSIS — R350 Frequency of micturition: Secondary | ICD-10-CM | POA: Diagnosis not present

## 2017-10-03 DIAGNOSIS — N3000 Acute cystitis without hematuria: Secondary | ICD-10-CM | POA: Diagnosis not present

## 2017-10-03 LAB — POC URINALSYSI DIPSTICK (AUTOMATED)
Bilirubin, UA: NEGATIVE
Glucose, UA: NEGATIVE
KETONES UA: NEGATIVE
LEUKOCYTES UA: NEGATIVE
Nitrite, UA: NEGATIVE
PH UA: 6 (ref 5.0–8.0)
PROTEIN UA: NEGATIVE
RBC UA: NEGATIVE
Spec Grav, UA: 1.015 (ref 1.010–1.025)
Urobilinogen, UA: 0.2 E.U./dL

## 2017-10-03 MED ORDER — NITROFURANTOIN MONOHYD MACRO 100 MG PO CAPS: 100.0000 mg | ORAL_CAPSULE | Freq: Two times a day (BID) | ORAL | 0 refills | Status: DC

## 2017-10-03 NOTE — Progress Notes (Signed)
Subjective:    Patient ID: Michele Jacobs, female    DOB: [birth year], 56 y.o.   MRN: [id number]  HPI   Michele Jacobs is a 56 yr old female who presents today with chief complaint of urinary frequency. Reports frequency/pelvic pressure since Monday.  No improvement with otc urinary relief meds. Denies fever, low back pain, or hematuria.   BP Readings from Last 3 Encounters:  10/03/17 99/61  04/30/17 110/62  12/31/16 108/70      Review of Systems See HPI  Past Medical History:  Diagnosis Date  . Anxiety   . Mitral valve prolapse      Social History   Social History  . Marital status: Single    Spouse name: N/A  . Number of children: N/A  . Years of education: N/A   Occupational History  . bookkeeper    Social History Main Topics  . Smoking status: Former Smoker    Types: Cigarettes  . Smokeless tobacco: Never Used     Comment: quit in 2009  . Alcohol use Yes  . Drug use: No  . Sexual activity: No   Other Topics Concern  . Not on file   Social History Narrative   Lives alone;   No children   Bookkeeper    Past Surgical History:  Procedure Laterality Date  . TUBAL LIGATION      Family History  Problem Relation Age of Onset  . Ovarian cancer Mother   . Cancer Mother   . Lymphoma Father   . Heart disease Father   . Cancer Father   . Non-Hodgkin's lymphoma Father     Allergies  Allergen Reactions  . Codeine Nausea And Vomiting    Current Outpatient Prescriptions on File Prior to Visit  Medication Sig Dispense Refill  . ALPRAZolam (XANAX) 0.25 MG tablet TAKE ONE TABLET BY MOUTH DAILY AS NEEDED FOR ANXIETY 30 tablet 0  . cholecalciferol (VITAMIN D) 1000 units tablet Take 1,000 Units by mouth daily.    Marland Kitchen erythromycin with ethanol (EMGEL) 2 % gel Apply topically daily. 30 g 0  . TOPROL XL 25 MG 24 hr tablet Take 1 tablet (25 mg total) by mouth daily. Brand only please 90 tablet 3   No current facility-administered medications on file prior to visit.      BP 99/61 (BP Location: Left Arm, Cuff Size: Normal)   Pulse 69   Temp 98 F (36.7 C) (Oral)   Resp 16   Ht  (1.651 m)   Wt 120 lb 12.8 oz (54.8 kg)   SpO2 99%   BMI 20.10 kg/m       Objective:   Physical Exam  Constitutional: She is oriented to person, place, and time. She appears well-developed and well-nourished.  Cardiovascular: Normal rate, regular rhythm and normal heart sounds.   No murmur heard. Pulmonary/Chest: Effort normal and breath sounds normal. No respiratory distress. She has no wheezes.  Abdominal: There is no CVA tenderness.  Musculoskeletal: She exhibits no edema.  Neurological: She is alert and oriented to person, place, and time.  Psychiatric: She has a normal mood and affect. Her behavior is normal. Judgment and thought content normal.          Assessment & Plan:  UTI- symptoms most consistent with UTI. UA notes ? Trace glucose, but I suspect this may be false positive due to AZO. Will send for formal UA with micro and culture. Begin empiric macrodantin due to symptoms.  Pt  is advised to call if new/worsening symptoms or if not improved in 2-3 days.

## 2017-10-03 NOTE — Patient Instructions (Signed)
Please begin macrobid for UTI. Call if symptoms worsen or if not improved in 2-3 days.

## 2017-10-06 ENCOUNTER — Encounter: Payer: Self-pay | Admitting: Family

## 2017-10-06 ENCOUNTER — Other Ambulatory Visit: Payer: Self-pay | Admitting: Family

## 2017-10-06 ENCOUNTER — Ambulatory Visit (INDEPENDENT_AMBULATORY_CARE_PROVIDER_SITE_OTHER): Payer: BLUE CROSS/BLUE SHIELD | Admitting: Family Medicine

## 2017-10-06 VITALS — BP 102/67 | HR 72 | Temp 98.1°F | Ht 65.0 in | Wt 119.0 lb

## 2017-10-06 DIAGNOSIS — R102 Pelvic and perineal pain: Secondary | ICD-10-CM

## 2017-10-06 DIAGNOSIS — R350 Frequency of micturition: Secondary | ICD-10-CM | POA: Diagnosis not present

## 2017-10-06 DIAGNOSIS — R1030 Lower abdominal pain, unspecified: Secondary | ICD-10-CM

## 2017-10-06 LAB — POC URINALSYSI DIPSTICK (AUTOMATED)
BILIRUBIN UA: NEGATIVE
Blood, UA: NEGATIVE
Glucose, UA: NEGATIVE
Ketones, UA: NEGATIVE
LEUKOCYTES UA: NEGATIVE
NITRITE UA: NEGATIVE
PH UA: 6 (ref 5.0–8.0)
PROTEIN UA: NEGATIVE
Spec Grav, UA: >=1.03 — AB (ref 1.010–1.025)
Urobilinogen, UA: 0.2 E.U./dL

## 2017-10-06 MED ORDER — CIPROFLOXACIN HCL 500 MG PO TABS: 500.0000 mg | ORAL_TABLET | Freq: Two times a day (BID) | ORAL | 0 refills | Status: DC

## 2017-10-06 NOTE — Addendum Note (Signed)
Addended by: Mervin Kung A on: 10/06/2017 08:11 AM   Modules accepted: Orders

## 2017-10-06 NOTE — Progress Notes (Signed)
[facility] [address], [address] [place], [place] [address] [phone]  Date:  10/06/2017   Name:  MIRCA YALE   DOB:  Jan 03, 1961   MRN:  [id number]  PCP:  Pearline Cables, MD    Chief Complaint: Abdominal Pain (Pressure in lower stomach)   History of Present Illness:  VASHON ARCH is a 56 y.o. very pleasant female patient who presents with the following:  I last saw her in May for a CPE She then saw Melissa on 10/12 for urinary frequency She was given macrodantin- urine culture pending but I do not see this back yet   Pt notes that she is started the macrodantin but did not seem to get better- they sent in some cipro but she did not pick it up yet as she wondered if there might be something else wrong Specifically she is worried about ovarian cancer She notes that her mother died of ovarian cancer  She has noted pelvic pressure and urinary frequency for a little over a week She will go, and then feel like she has to go again right away No burning with urination No blood in her urine No vaginal symptoms No fever or chills.  No CVA tenderness but she does have some discomfort over her right sciatic notch  She has an occasional sharp vaginal pain but no itching  She went to OBG recently but did not need a pap- she did have a pelvic which was normal per her report   She does not have any nausea, vomiting, diarrhea Weight is stable Wt Readings from Last 3 Encounters:  10/06/17 119 lb (54 kg)  10/03/17 120 lb 12.8 oz (54.8 kg)  04/30/17 117 lb 6.4 oz (53.3 kg)   Former smoker  Patient Active Problem List   Diagnosis Date Noted  . Heart palpitations 04/24/2015  . Osteopenia 04/24/2015  . Mitral valve prolapse 01/20/2014  . Palpitations 01/20/2014    Past Medical History:  Diagnosis Date  . Anxiety   . Mitral valve prolapse     Past Surgical History:  Procedure Laterality Date  . TUBAL LIGATION       Social History  Substance Use Topics  . Smoking status: Former Smoker    Types: Cigarettes  . Smokeless tobacco: Never Used     Comment: quit in 2009  . Alcohol use Yes    Family History  Problem Relation Age of Onset  . Ovarian cancer Mother   . Cancer Mother   . Lymphoma Father   . Heart disease Father   . Cancer Father   . Non-Hodgkin's lymphoma Father     Allergies  Allergen Reactions  . Codeine Nausea And Vomiting    Medication list has been reviewed and updated.  Current Outpatient Prescriptions on File Prior to Visit  Medication Sig Dispense Refill  . ALPRAZolam (XANAX) 0.25 MG tablet TAKE ONE TABLET BY MOUTH DAILY AS NEEDED FOR ANXIETY 30 tablet 0  . cholecalciferol (VITAMIN D) 1000 units tablet Take 1,000 Units by mouth daily.    Marland Kitchen erythromycin with ethanol (EMGEL) 2 % gel Apply topically daily. 30 g 0  . TOPROL XL 25 MG 24 hr tablet Take 1 tablet (25 mg total) by mouth daily. Brand only please 90 tablet 3  . nitrofurantoin, macrocrystal-monohydrate, (MACROBID) 100 MG capsule Take 1 capsule (100 mg total) by mouth 2 (two) times daily. (Patient not taking: Reported on 10/06/2017) 14  capsule 0   No current facility-administered medications on file prior to visit.     Review of Systems:  As per HPI- otherwise negative. No fever or chills No CP or SOB No nausea, vomiting or diarrhea    Physical Examination: Vitals:   10/06/17 1418  BP: 102/67  Pulse: 72  Temp: 98.1 F (36.7 C)  SpO2: 100%   Vitals:   10/06/17 1418  Weight: 119 lb (54 kg)  Height:  (1.651 m)   Body mass index is 19.8 kg/m. Ideal Body Weight: Weight in (lb) to have BMI = 25: 149.9  GEN: WDWN, NAD, Non-toxic, A & O x 3 HEENT: Atraumatic, Normocephalic. Neck supple. No masses, No LAD. Ears and Nose: No external deformity. CV: RRR, No M/G/R. No JVD. No thrill. No extra heart sounds. PULM: CTA B, no wheezes, crackles, rhonchi. No retractions. No resp. distress. No accessory  muscle use. ABD: S, NT, ND, +BS. No rebound. No HSM. EXTR: No c/c/e NEURO Normal gait.  PSYCH: Normally interactive. Conversant. Not depressed or anxious appearing.  Calm demeanor.  Breast: normal exam, no masses/ dimpling/ discharge Pelvic: normal, no vaginal lesions or discharge. Uterus normal, no CMT, no adnexal tendereness or masses  Results for orders placed or performed in visit on 10/06/17  POCT Urinalysis Dipstick (Automated)  Result Value Ref Range   Color, UA Yellow    Clarity, UA Semi-cloudy    Glucose, UA Neg    Bilirubin, UA Neg    Ketones, UA Neg    Spec Grav, UA >=1.030 (A) 1.010 - 1.025   Blood, UA Neg    pH, UA 6.0 5.0 - 8.0   Protein, UA Neg    Urobilinogen, UA 0.2 0.2 or 1.0 E.U./dL   Nitrite, UA Neg    Leukocytes, UA Negative Negative     Assessment and Plan: Lower abdominal pain - Plan: POCT Urinalysis Dipstick (Automated), US Pelvis Complete  Pelvic pressure in female - Plan: CA 125, US Pelvis Complete, US Transvaginal Non-OB  Here today with pelvic pressure.  We are treating her for a UTI, but as her sx have not gotten better she wonders if there may be something else going on Await urine culture from last week, but today UA is reassuring Will check a CA-125 and pelvic US for her Will plan further follow- up pending labs.   Signed Abbe Amsterdam, MD

## 2017-10-06 NOTE — Patient Instructions (Signed)
It was good to see you again today- we will get a Ca-125 and an ultrasound for you asap

## 2017-10-06 NOTE — Progress Notes (Signed)
Pre visit review using our clinic tool,if applicable. No additional management support is needed unless otherwise documented below in the visit note.  

## 2017-10-06 NOTE — Addendum Note (Signed)
Addended by: Verdie Shire on: 10/06/2017 08:12 AM   Modules accepted: Orders

## 2017-10-07 ENCOUNTER — Encounter: Payer: Self-pay | Admitting: Family

## 2017-10-07 LAB — URINE CULTURE
MICRO NUMBER:: 81146356
SPECIMEN QUALITY:: ADEQUATE

## 2017-10-07 LAB — CA 125: CA 125: 11 U/mL (ref <35)

## 2017-10-08 ENCOUNTER — Encounter: Payer: Self-pay | Admitting: Family Medicine

## 2017-10-08 ENCOUNTER — Encounter (HOSPITAL_BASED_OUTPATIENT_CLINIC_OR_DEPARTMENT_OTHER): Payer: Self-pay

## 2017-10-08 ENCOUNTER — Ambulatory Visit (HOSPITAL_BASED_OUTPATIENT_CLINIC_OR_DEPARTMENT_OTHER)
Admission: RE | Admit: 2017-10-08 | Discharge: 2017-10-08 | Disposition: A | Discharge status: Home / Self Care | Payer: BLUE CROSS/BLUE SHIELD | Source: Ambulatory Visit | Attending: Family Medicine | Admitting: Family Medicine

## 2017-10-08 DIAGNOSIS — R1030 Lower abdominal pain, unspecified: Secondary | ICD-10-CM | POA: Diagnosis not present

## 2017-10-08 DIAGNOSIS — R102 Pelvic and perineal pain: Secondary | ICD-10-CM | POA: Diagnosis not present

## 2017-10-08 NOTE — Telephone Encounter (Signed)
Pt seen by PCP on 10/06/17; will defer results / recommendations to her. Note routed to PCP.

## 2017-10-08 NOTE — Telephone Encounter (Signed)
Please contact pt re: unread mychart message. 

## 2017-10-10 ENCOUNTER — Encounter: Payer: Self-pay | Admitting: Family Medicine

## 2017-10-15 ENCOUNTER — Ambulatory Visit (HOSPITAL_BASED_OUTPATIENT_CLINIC_OR_DEPARTMENT_OTHER): Payer: BLUE CROSS/BLUE SHIELD

## 2017-11-17 ENCOUNTER — Other Ambulatory Visit: Payer: Self-pay | Admitting: Family Medicine

## 2017-11-17 DIAGNOSIS — F411 Generalized anxiety disorder: Secondary | ICD-10-CM

## 2017-11-17 MED ORDER — ALPRAZOLAM 0.25 MG PO TABS: ORAL_TABLET | ORAL | 0 refills | Status: DC

## 2017-11-17 MED ORDER — ERYTHROMYCIN 2 % EX GEL: Freq: Every day | CUTANEOUS | 1 refills | Status: DC

## 2017-11-17 NOTE — Telephone Encounter (Signed)
Pt is requesting refill on Emgel 2 % and alprazolam.

## 2017-12-17 ENCOUNTER — Ambulatory Visit (HOSPITAL_BASED_OUTPATIENT_CLINIC_OR_DEPARTMENT_OTHER)
Admission: RE | Admit: 2017-12-17 | Discharge: 2017-12-17 | Disposition: A | Discharge status: Home / Self Care | Payer: BLUE CROSS/BLUE SHIELD | Source: Ambulatory Visit | Attending: Family Medicine | Admitting: Family Medicine

## 2017-12-17 ENCOUNTER — Encounter: Payer: Self-pay | Admitting: Family Medicine

## 2017-12-17 ENCOUNTER — Ambulatory Visit: Payer: BLUE CROSS/BLUE SHIELD | Admitting: Family Medicine

## 2017-12-17 VITALS — BP 115/66 | HR 62 | Temp 98.1°F | Resp 16 | Ht 65.0 in | Wt 119.6 lb

## 2017-12-17 DIAGNOSIS — M542 Cervicalgia: Secondary | ICD-10-CM

## 2017-12-17 DIAGNOSIS — E041 Nontoxic single thyroid nodule: Secondary | ICD-10-CM | POA: Diagnosis not present

## 2017-12-17 LAB — CBC
HCT: 40.9 % (ref 36.0–46.0)
Hemoglobin: 13.6 g/dL (ref 12.0–15.0)
MCHC: 33.2 g/dL (ref 30.0–36.0)
MCV: 94.8 fl (ref 78.0–100.0)
PLATELETS: 205 10*3/uL (ref 150.0–400.0)
RBC: 4.32 Mil/uL (ref 3.87–5.11)
RDW: 13.5 % (ref 11.5–15.5)
WBC: 4.5 10*3/uL (ref 4.0–10.5)

## 2017-12-17 LAB — TSH: TSH: 1.18 u[IU]/mL (ref 0.35–4.50)

## 2017-12-17 NOTE — Patient Instructions (Signed)
Take care!  We will get labs and x-rays of your neck today, and I will set up an ultrasound of your thyroid gland for the next week or so. I will be in touch with your results asap

## 2017-12-17 NOTE — Progress Notes (Addendum)
[facility] [address], [address] [place], [place] [address] [phone] [phone]  Date:  12/17/2017   Name:  Michele Jacobs   DOB:  [birth year]   MRN:  [id number]  PCP:  Pearline Cablesopland, [name], MD    Chief Complaint: Neck Pain (knot on neck painful to turn neck )   History of Present Illness:  Michele Jacobs is a 56 y.o. very pleasant female patient who presents with the following:  history of MVP and osteopenia- here today with concern of a neck issue Right after Thanksgiving she got what she thought was a crick in her neck, but it did not go away. She also now feels like there is a tender area in her right anterior neck.  It feels like a little knot in her neck.  NKI She woke up with the crick in her neck- it seemed like a normal thing, but has just not gone away No fever or chills She otherwise feels good Her throat "burns all the time" but she has been dx with silent reflux by her ENT  She has not tried any OTC meds or topicals as of yet for her neck pain. Discussed a muscle relaxer but she would just as well use some OTC aleve or similar   Patient Active Problem List   Diagnosis Date Noted  . Heart palpitations 04/24/2015  . Osteopenia 04/24/2015  . Mitral valve prolapse 01/20/2014  . Palpitations 01/20/2014    Past Medical History:  Diagnosis Date  . Anxiety   . Mitral valve prolapse     Past Surgical History:  Procedure Laterality Date  . TUBAL LIGATION      Social History   Tobacco Use  . Smoking status: Former Smoker    Packs/day: 0.10    Years: 30.00    Pack years: 3.00    Types: Cigarettes  . Smokeless tobacco: Never Used  . Tobacco comment: quit in 2009  Substance Use Topics  . Alcohol use: No  . Drug use: No    Family History  Problem Relation Age of Onset  . Ovarian cancer Mother   . Cancer Mother   . Lymphoma Father   . Heart disease Father   . Cancer Father   . Non-Hodgkin's lymphoma Father      Allergies  Allergen Reactions  . Codeine Nausea And Vomiting    Medication list has been reviewed and updated.  Current Outpatient Medications on File Prior to Visit  Medication Sig Dispense Refill  . ALPRAZolam (XANAX) 0.25 MG tablet TAKE ONE TABLET BY MOUTH DAILY AS NEEDED FOR ANXIETY 30 tablet 0  . cholecalciferol (VITAMIN D) 1000 units tablet Take 1,000 Units by mouth daily.    Marland Kitchen. erythromycin with ethanol (EMGEL) 2 % gel Apply topically daily. 30 g 1  . nitrofurantoin, macrocrystal-monohydrate, (MACROBID) 100 MG capsule Take 1 capsule (100 mg total) by mouth 2 (two) times daily. 14 capsule 0  . TOPROL XL 25 MG 24 hr tablet Take 1 tablet (25 mg total) by mouth daily. Brand only please 90 tablet 3   No current facility-administered medications on file prior to visit.     Review of Systems:  As per HPI- otherwise negative.  No fever or chills No history of thyroid issues, normal TSH in the past   Physical Examination: Vitals:   12/17/17 0906  BP: 115/66  Pulse: 62  Resp: 16  Temp: 98.1 F (36.7 C)  SpO2: 100%   Vitals:   12/17/17 0906  Weight: 119 lb 9.6 oz (54.3 kg)  Height: 5\' 5"  (1.651 m)   Body mass index is 19.9 kg/m. Ideal Body Weight: Weight in (lb) to have BMI = 25: 149.9  GEN: WDWN, NAD, Non-toxic, A & O x 3, slim build, looks well HEENT: Atraumatic, Normocephalic. Neck supple. No masses, No LAD.  Bilateral TM wnl, oropharynx normal.  PEERL,EOMI.   There is a small nodule- seems less than 1 cm- which may be on the right lobe of the thyroid Normal ROM of her neck.  She does have some tenderness in the bilateral trapezius muscles  Ears and Nose: No external deformity. CV: RRR, No M/G/R. No JVD. No thrill. No extra heart sounds. PULM: CTA B, no wheezes, crackles, rhonchi. No retractions. No resp. distress. No accessory muscle use. EXTR: No c/c/e NEURO Normal gait.  PSYCH: Normally interactive. Conversant. Not depressed or anxious appearing.  Calm  demeanor.    Assessment and Plan: Neck pain - Plan: DG Cervical Spine Complete  Thyroid nodule - Plan: US THYROID, CBC, TSH  Here today with concern of persistent neck stiffness and pain, and a possible nodule in her neck, seems to be in the right thyroid lobe.   Will obtain films and labs as above, Will plan further follow- up pending labs.  Signed Abbe Amsterdam, MD  Received her imaging and lab results  Results for orders placed or performed in visit on 12/17/17  CBC  Result Value Ref Range   WBC 4.5 4.0 - 10.5 K/uL   RBC 4.32 3.87 - 5.11 Mil/uL   Platelets 205.0 150.0 - 400.0 K/uL   Hemoglobin 13.6 12.0 - 15.0 g/dL   HCT 42.5 95.6 - 38.7 %   MCV 94.8 78.0 - 100.0 fl   MCHC 33.2 30.0 - 36.0 g/dL   RDW 56.4 33.2 - 95.1 %  TSH  Result Value Ref Range   TSH 1.18 0.35 - 4.50 uIU/mL   Dg Cervical Spine Complete  Result Date: 12/17/2017 CLINICAL DATA:  Bilateral neck pain for the past month. Palpable knot over the thyroid region on the right for the past week. EXAM: CERVICAL SPINE - COMPLETE 4+ VIEW COMPARISON:  None in PACs FINDINGS: The cervical vertebral bodies are preserved in height. There is mild loss of the normal cervical lordosis. There is mild disc space narrowing at C3-4, C4-5, and C5-6. There small anterior endplate osteophytes from C3 through C6. There is no perched facet or spinous process fracture. The oblique views reveal no high-grade bony encroachment upon the neural foramina. The odontoid is intact. The prevertebral soft tissue spaces are normal. IMPRESSION: There is mild multilevel degenerative disc space narrowing of the mid cervical spine with smaller in plate osteophytes. No compression fracture nor significant bony encroachment upon the neural foramina. Electronically Signed   By: David  [place] M.D.   On: 12/17/2017 10:26   US Thyroid  Result Date: 12/17/2017 CLINICAL DATA:  Palpable abnormality. Neck stiffness. Potential thyroid nodule palpated on  physical examination. EXAM: THYROID ULTRASOUND TECHNIQUE: Ultrasound examination of the thyroid gland and adjacent soft tissues was performed. COMPARISON:  None. FINDINGS: Parenchymal Echotexture: Mildly heterogenous Isthmus: Normal in size measures 0.2 cm in diameter Right lobe: Normal in size measuring 4.6 x 1.1 x 1.4 cm Left lobe: Normal in size measuring 4.1 x 1.0 x 1.3 cm _________________________________________________________ Estimated total number of nodules >/= 1 cm: 0 Number of spongiform nodules >/=  2 cm not described  below (TR1): 0 Number of mixed cystic and solid nodules >/= 1.5 cm not described below (TR2): 0 _________________________________________________________ There is a punctate (approximately 0.2 cm) anechoic cysts within the medial, inferior aspect of the right lobe of the thyroid which does not meet imaging criteria to recommend percutaneous sampling or dedicated follow-up. The patient's palpable area of concern appears to correlate with a partially solid though predominantly cystic approximately 1.6 x 1.4 x 0.9 cm nodule within the subcutaneous tissues of the midline of the upper neck. This structure appears separate from both the adjacent thyroid and submandibular glands. IMPRESSION: 1. Patient's palpable area of concern correlates with a partially solid though predominantly cystic subcutaneous nodule with the midline of the upper neck which appears separate from both the adjacent thyroid and submandibular glands. Differential considerations are broad though include a necrotic lymph node. Further evaluation with contrast-enhanced neck CT could be performed as indicated. 2. Solitary punctate (approximately 0.2 cm) cyst within the right lobe of the thyroid does not meet imaging criteria to recommend percutaneous sampling or continued dedicated follow-up. The above is in keeping with the ACR TI-RADS recommendations - J Am Coll Radiol 2017;14:587-595. Electronically Signed   By: Simonne ComeJohn  Watts  M.D.   On: 12/17/2017 10:23

## 2017-12-18 ENCOUNTER — Encounter: Payer: Self-pay | Admitting: Family Medicine

## 2017-12-18 DIAGNOSIS — R221 Localized swelling, mass and lump, neck: Secondary | ICD-10-CM

## 2017-12-19 ENCOUNTER — Telehealth: Payer: Self-pay | Admitting: Family Medicine

## 2017-12-19 NOTE — Telephone Encounter (Signed)
Copied from CRM [phone]#[id number]. Topic: General - Other >> Dec 19, 2017 12:09 PM Cecelia ByarsGreen, Temeka L, RMA wrote: Reason for CRM: [facility] orders for CT scan of neck with contrast [facility] [place] fax # [phone]

## 2017-12-19 NOTE — Telephone Encounter (Signed)
Copied from CRM [id number]. Topic: General - Other >> Dec 19, 2017 12:09 PM Cecelia ByarsGreen, Michele Jacobs, RMA wrote: Reason for CRM: [facility] and is requesting orders for CT scan of neck with contrast [facility] [place] fax # [phone]  >> Dec 19, 2017  3:05 PM Elliot GaultBell, Michele Jacobs wrote: Jethro Bolusaller name: Michele HerterShannon  Relation to pt:  [facility]  Call back number : [phone] option [phone] fax # [phone]    Reason for call:  Please fax  CT soft tissue of the neck with contrast order to [phone]. Patient  appt scheduled for Monday 12/22/17 at 11:45pm.

## 2017-12-20 ENCOUNTER — Ambulatory Visit (HOSPITAL_BASED_OUTPATIENT_CLINIC_OR_DEPARTMENT_OTHER): Payer: BLUE CROSS/BLUE SHIELD

## 2017-12-22 DIAGNOSIS — R221 Localized swelling, mass and lump, neck: Secondary | ICD-10-CM | POA: Diagnosis not present

## 2017-12-22 NOTE — Telephone Encounter (Signed)
Provider has signed order and it has been faxed back as requested.

## 2017-12-26 ENCOUNTER — Other Ambulatory Visit: Payer: Self-pay | Admitting: Family Medicine

## 2017-12-26 ENCOUNTER — Encounter: Payer: Self-pay | Admitting: Family Medicine

## 2017-12-26 DIAGNOSIS — Q892 Congenital malformations of other endocrine glands: Secondary | ICD-10-CM

## 2017-12-26 NOTE — Progress Notes (Signed)
Called pt and went over her CT- she has a thyroglossal duct cyst.  Referral to her ENT- Jearld FentonByers- for further eval. CT is on paper, faxed from [facility].  Will also need to fax to Dr Jearld FentonByers office

## 2017-12-30 DIAGNOSIS — Q892 Congenital malformations of other endocrine glands: Secondary | ICD-10-CM

## 2017-12-30 HISTORY — DX: Congenital malformations of other endocrine glands: Q89.2

## 2018-01-06 DIAGNOSIS — Q892 Congenital malformations of other endocrine glands: Secondary | ICD-10-CM | POA: Diagnosis not present

## 2018-01-06 DIAGNOSIS — K219 Gastro-esophageal reflux disease without esophagitis: Secondary | ICD-10-CM | POA: Diagnosis not present

## 2018-01-13 ENCOUNTER — Telehealth: Payer: Self-pay

## 2018-01-13 NOTE — Telephone Encounter (Signed)
PA initiated via Covermymeds; KEY: [id number]. Awaiting determination.

## 2018-01-13 NOTE — Telephone Encounter (Signed)
PA denied. Waiting for denial information.  

## 2018-01-13 NOTE — Telephone Encounter (Signed)
PA denied. Pt must first try atenolol, bisoprolol, etc.

## 2018-01-16 NOTE — Telephone Encounter (Signed)
Called her- she plans to just continue taking the toprol xl as she does well with this, she will pay for it

## 2018-01-23 HISTORY — PX: THYROGLOSSAL DUCT CYST: SHX297

## 2018-02-18 DIAGNOSIS — H1089 Other conjunctivitis: Secondary | ICD-10-CM | POA: Diagnosis not present

## 2018-04-11 ENCOUNTER — Other Ambulatory Visit: Payer: Self-pay | Admitting: Family Medicine

## 2018-04-11 DIAGNOSIS — R002 Palpitations: Secondary | ICD-10-CM

## 2018-05-02 NOTE — Progress Notes (Addendum)
[facility] [address] [place], [place] [address] [phone]  Date:  05/04/2018   Name:  Michele Jacobs   DOB:  1961-04-14   MRN:  [id number]  PCP:  Pearline Cables, MD    Chief Complaint: Annual Exam (fasting labs, with pap)   History of Present Illness:  Michele Jacobs is a 57 y.o. very pleasant female patient who presents with the following:  Here today for a CPE Last visit with me was in January when she was dx with a throglossal duct cyst.  I referred her to ENT- Dr. Jearld Fenton This was removed and pathology was benign   Mammo: gyn- in October, normal  Pap: gyn, done in October and ok. No recent abnormal, she is not SA  Labs: a year ago- she is fasting today  Immunizations; could consider shingrix- not in stock however. Pt will wait till next year  Colon:  5/17  From our last physical about a year ago History of anxiety, osteopenia, palpitations Last labs about one year ago- looked good at that time dexa done 2 years ago- may want to update now.  She will do this per her GYN She has moved in with her dad- he is still with [place] but is getting weaker. She is fasting today for labs  She does still go to [facility] and sees Wardell Honour since Dr. Chevis Pretty retired She had been a cardiology pt- she is on metoprolol for her palpitations.  This works well for her- states that she has to have the name brand.  Needs refill today Reviewed NCCSR:  Filled 30 xanax on 12/04/16, no other entries  History of anxiety, borderline underweight Last labs in May of 201 She is feeling well, she is moving in with her dad so as to care for her. She is going to sell her home. Her father has cancer and is not expected to live more than 6 months or so- he needs her to care for him.  History of osteopenia and low vitamin D- she took the rx supplement last year and is now on an OTC 1,000 IU dose Former smoker- quit about 10 years ago She  exercises by running a mile on a treadmill daily- no CP or SOB with exercise Her anxiety is under control.  sometimes will use her xanax when her dad is being more irritable  NCCSR: she got 30 xanax in November of 18, does not need any more today She had a pap, mammo and dexa per GYN back in October.  However her provider Almyra Free left the practice so she plans to get these services from me in the future  Her dad is still with [place], but he is getting sicker  She is using metopolol for palpitations- generally she is doing well but does occasionally have some palpitations She is walking fast on a treadmill daily- she generally does a mile, perhaps 2-3x a day   Wt Readings from Last 3 Encounters:  05/04/18 119 lb 3.2 oz (54.1 kg)  12/17/17 119 lb 9.6 oz (54.3 kg)  10/06/17 119 lb (54 kg)   She has noted some leg and foot cramping- this has been doing on for about 2 weeks, she is not sure why Otherwise she is feeling well overall   Pulse Readings from Last 3 Encounters:  05/04/18 (!) 58  12/17/17 62  10/06/17 72      Patient Active Problem  List   Diagnosis Date Noted  . Heart palpitations 04/24/2015  . Osteopenia 04/24/2015  . Mitral valve prolapse 01/20/2014  . Palpitations 01/20/2014    Past Medical History:  Diagnosis Date  . Anxiety   . Mitral valve prolapse     Past Surgical History:  Procedure Laterality Date  . TUBAL LIGATION      Social History   Tobacco Use  . Smoking status: Former Smoker    Packs/day: 0.10    Years: 30.00    Pack years: 3.00    Types: Cigarettes  . Smokeless tobacco: Never Used  . Tobacco comment: quit in 2009  Substance Use Topics  . Alcohol use: No  . Drug use: No    Family History  Problem Relation Age of Onset  . Ovarian cancer Mother   . Cancer Mother   . Lymphoma Father   . Heart disease Father   . Cancer Father   . Non-Hodgkin's lymphoma Father     Allergies  Allergen Reactions  . Codeine Nausea And Vomiting     Medication list has been reviewed and updated.  Current Outpatient Medications on File Prior to Visit  Medication Sig Dispense Refill  . ALPRAZolam (XANAX) 0.25 MG tablet TAKE ONE TABLET BY MOUTH DAILY AS NEEDED FOR ANXIETY 30 tablet 0  . cholecalciferol (VITAMIN D) 1000 units tablet Take 1,000 Units by mouth daily.    Marland Kitchen erythromycin with ethanol (EMGEL) 2 % gel Apply topically daily. 30 g 1  . TOPROL XL 25 MG 24 hr tablet TAKE ONE TABLET BY MOUTH DAILY 90 tablet 1   No current facility-administered medications on file prior to visit.     Review of Systems: As per HPI- otherwise negative. Admits to using a tanning bed every other week No CP or SOB No depression but she does feel a bit anxious about her dad and the prospect of finding him deceased at home in the future    Physical Examination: Vitals:   05/04/18 0913  BP: 100/60  Pulse: (!) 58  Resp: 16  SpO2: 98%   Vitals:   05/04/18 0913  Weight: 119 lb 3.2 oz (54.1 kg)  Height:  (1.651 m)   Body mass index is 19.84 kg/m. Ideal Body Weight: Weight in (lb) to have BMI = 25: 149.9  GEN: WDWN, NAD, Non-toxic, A & O x 3, slim build, tan  HEENT: Atraumatic, Normocephalic. Neck supple. No masses, No LAD.  Bilateral TM wnl, oropharynx normal.  PEERL,EOMI.   Small scar over neck from cyst removal last year. Well healed  Ears and Nose: No external deformity. CV: RRR, No M/G/R. No JVD. No thrill. No extra heart sounds. PULM: CTA B, no wheezes, crackles, rhonchi. No retractions. No resp. distress. No accessory muscle use. ABD: S, NT, ND No suspicious skin findings today EXTR: No c/c/e NEURO Normal gait.  PSYCH: Normally interactive. Conversant. Not depressed or anxious appearing.  Calm demeanor.    Assessment and Plan: Physical exam  Screening for hyperlipidemia - Plan: Lipid panel  Screening for deficiency anemia - Plan: CBC  Screening for diabetes mellitus - Plan: Comprehensive metabolic panel, Hemoglobin  A1c  Osteopenia, unspecified location  CPE today Labs pending as above She will see [place] for GYN care, but this is all UTD for now  Encouraged her to stop using tanning beds   Signed Abbe Amsterdam, MD  Received her labs, message to pt  Results for orders placed or performed in visit on 05/04/18  CBC  Result Value Ref Range   WBC 4.7 4.0 - 10.5 K/uL   RBC 4.32 3.87 - 5.11 Mil/uL   Platelets 220.0 150.0 - 400.0 K/uL   Hemoglobin 13.4 12.0 - 15.0 g/dL   HCT 16.1 09.6 - 04.5 %   MCV 93.4 78.0 - 100.0 fl   MCHC 33.2 30.0 - 36.0 g/dL   RDW 40.9 81.1 - 91.4 %  Comprehensive metabolic panel  Result Value Ref Range   Sodium 140 135 - 145 mEq/L   Potassium 4.7 3.5 - 5.1 mEq/L   Chloride 104 96 - 112 mEq/L   CO2 31 19 - 32 mEq/L   Glucose, Bld 80 70 - 99 mg/dL   BUN 14 6 - 23 mg/dL   Creatinine, Ser 7.82 0.40 - 1.20 mg/dL   Total Bilirubin 1.4 (H) 0.2 - 1.2 mg/dL   Alkaline Phosphatase 89 39 - 117 U/L   AST 16 0 - 37 U/L   ALT 15 0 - 35 U/L   Total Protein 7.0 6.0 - 8.3 g/dL   Albumin 4.4 3.5 - 5.2 g/dL   Calcium 9.7 8.4 - 95.6 mg/dL   GFR 21.30 >86.57 mL/min  Hemoglobin A1c  Result Value Ref Range   Hgb A1c MFr Bld 5.4 4.6 - 6.5 %  Lipid panel  Result Value Ref Range   Cholesterol 174 0 - 200 mg/dL   Triglycerides 84.6 0.0 - 149.0 mg/dL   HDL 96.29 >52.84 mg/dL   VLDL 13.2 0.0 - 44.0 mg/dL   LDL Cholesterol 80 0 - 99 mg/dL   Total CHOL/HDL Ratio 2    NonHDL 97.57

## 2018-05-04 ENCOUNTER — Ambulatory Visit (INDEPENDENT_AMBULATORY_CARE_PROVIDER_SITE_OTHER): Payer: BLUE CROSS/BLUE SHIELD | Admitting: Family Medicine

## 2018-05-04 ENCOUNTER — Encounter: Payer: Self-pay | Admitting: Family Medicine

## 2018-05-04 VITALS — BP 100/60 | HR 58 | Resp 16 | Ht 65.0 in | Wt 119.2 lb

## 2018-05-04 DIAGNOSIS — Z122 Encounter for screening for malignant neoplasm of respiratory organs: Secondary | ICD-10-CM

## 2018-05-04 DIAGNOSIS — M8580 Other specified disorders of bone density and structure, unspecified site: Secondary | ICD-10-CM

## 2018-05-04 DIAGNOSIS — Z131 Encounter for screening for diabetes mellitus: Secondary | ICD-10-CM

## 2018-05-04 DIAGNOSIS — Z13220 Encounter for screening for lipoid disorders: Secondary | ICD-10-CM | POA: Diagnosis not present

## 2018-05-04 DIAGNOSIS — Z0000 Encounter for general adult medical examination without abnormal findings: Secondary | ICD-10-CM

## 2018-05-04 DIAGNOSIS — Z130 Encounter for screening for diseases of the blood and blood-forming organs and certain disorders involving the immune mechanism: Secondary | ICD-10-CM

## 2018-05-04 LAB — COMPREHENSIVE METABOLIC PANEL
ALBUMIN: 4.4 g/dL (ref 3.5–5.2)
ALK PHOS: 89 U/L (ref 39–117)
ALT: 15 U/L (ref 0–35)
AST: 16 U/L (ref 0–37)
BILIRUBIN TOTAL: 1.4 mg/dL — AB (ref 0.2–1.2)
BUN: 14 mg/dL (ref 6–23)
CO2: 31 mEq/L (ref 19–32)
Calcium: 9.7 mg/dL (ref 8.4–10.5)
Chloride: 104 mEq/L (ref 96–112)
Creatinine, Ser: 0.73 mg/dL (ref 0.40–1.20)
GFR: 87.74 mL/min (ref >60.00)
Glucose, Bld: 80 mg/dL (ref 70–99)
Potassium: 4.7 mEq/L (ref 3.5–5.1)
Sodium: 140 mEq/L (ref 135–145)
TOTAL PROTEIN: 7 g/dL (ref 6.0–8.3)

## 2018-05-04 LAB — LIPID PANEL
Cholesterol: 174 mg/dL (ref 0–200)
HDL: 76.5 mg/dL (ref >39.00)
LDL Cholesterol: 80 mg/dL (ref 0–99)
NONHDL: 97.57
TRIGLYCERIDES: 87 mg/dL (ref 0.0–149.0)
Total CHOL/HDL Ratio: 2
VLDL: 17.4 mg/dL (ref 0.0–40.0)

## 2018-05-04 LAB — CBC
HCT: 40.3 % (ref 36.0–46.0)
HEMOGLOBIN: 13.4 g/dL (ref 12.0–15.0)
MCHC: 33.2 g/dL (ref 30.0–36.0)
MCV: 93.4 fl (ref 78.0–100.0)
PLATELETS: 220 10*3/uL (ref 150.0–400.0)
RBC: 4.32 Mil/uL (ref 3.87–5.11)
RDW: 13.5 % (ref 11.5–15.5)
WBC: 4.7 10*3/uL (ref 4.0–10.5)

## 2018-05-04 LAB — HEMOGLOBIN A1C: HEMOGLOBIN A1C: 5.4 % (ref 4.6–6.5)

## 2018-05-04 NOTE — Patient Instructions (Signed)
Great to see you as always!  Take care, let me know if your anxiety is getting out of hand I'll be in touch with your labs results asap I would encourage you to stop using tanning beds, as they can increase your risk of skin cancer You can have the shingles vaccine- shingrix, a 2 shot series- at your convenience   Health Maintenance for Postmenopausal Women Menopause is a normal process in which your reproductive ability comes to an end. This process happens gradually over a span of months to years, usually between the ages of 58 and 73. Menopause is complete when you have missed 12 consecutive menstrual periods. It is important to talk with your health care provider about some of the most common conditions that affect postmenopausal women, such as heart disease, cancer, and bone loss (osteoporosis). Adopting a healthy lifestyle and getting preventive care can help to promote your health and wellness. Those actions can also lower your chances of developing some of these common conditions. What should I know about menopause? During menopause, you may experience a number of symptoms, such as:  Moderate-to-severe hot flashes.  Night sweats.  Decrease in sex drive.  Mood swings.  Headaches.  Tiredness.  Irritability.  Memory problems.  Insomnia.  Choosing to treat or not to treat menopausal changes is an individual decision that you make with your health care provider. What should I know about hormone replacement therapy and supplements? Hormone therapy products are effective for treating symptoms that are associated with menopause, such as hot flashes and night sweats. Hormone replacement carries certain risks, especially as you become older. If you are thinking about using estrogen or estrogen with progestin treatments, discuss the benefits and risks with your health care provider. What should I know about heart disease and stroke? Heart disease, heart attack, and stroke become more  likely as you age. This may be due, in part, to the hormonal changes that your body experiences during menopause. These can affect how your body processes dietary fats, triglycerides, and cholesterol. Heart attack and stroke are both medical emergencies. There are many things that you can do to help prevent heart disease and stroke:  Have your blood pressure checked at least every 1-2 years. High blood pressure causes heart disease and increases the risk of stroke.  If you are 83-60 years old, ask your health care provider if you should take aspirin to prevent a heart attack or a stroke.  Do not use any tobacco products, including cigarettes, chewing tobacco, or electronic cigarettes. If you need help quitting, ask your health care provider.  It is important to eat a healthy diet and maintain a healthy weight. ? Be sure to include plenty of vegetables, fruits, low-fat dairy products, and lean protein. ? Avoid eating foods that are high in solid fats, added sugars, or salt (sodium).  Get regular exercise. This is one of the most important things that you can do for your health. ? Try to exercise for at least 150 minutes each week. The type of exercise that you do should increase your heart rate and make you sweat. This is known as moderate-intensity exercise. ? Try to do strengthening exercises at least twice each week. Do these in addition to the moderate-intensity exercise.  Know your numbers.Ask your health care provider to check your cholesterol and your blood glucose. Continue to have your blood tested as directed by your health care provider.  What should I know about cancer screening? There are several types  of cancer. Take the following steps to reduce your risk and to catch any cancer development as early as possible. Breast Cancer  Practice breast self-awareness. ? This means understanding how your breasts normally appear and feel. ? It also means doing regular breast self-exams.  Let your health care provider know about any changes, no matter how small.  If you are 70 or older, have a clinician do a breast exam (clinical breast exam or CBE) every year. Depending on your age, family history, and medical history, it may be recommended that you also have a yearly breast X-ray (mammogram).  If you have a family history of breast cancer, talk with your health care provider about genetic screening.  If you are at high risk for breast cancer, talk with your health care provider about having an MRI and a mammogram every year.  Breast cancer (BRCA) gene test is recommended for women who have family members with BRCA-related cancers. Results of the assessment will determine the need for genetic counseling and BRCA1 and for BRCA2 testing. BRCA-related cancers include these types: ? Breast. This occurs in males or females. ? Ovarian. ? Tubal. This may also be called fallopian tube cancer. ? Cancer of the abdominal or pelvic lining (peritoneal cancer). ? Prostate. ? Pancreatic.  Cervical, Uterine, and Ovarian Cancer Your health care provider may recommend that you be screened regularly for cancer of the pelvic organs. These include your ovaries, uterus, and vagina. This screening involves a pelvic exam, which includes checking for microscopic changes to the surface of your cervix (Pap test).  For women ages 21-65, health care providers may recommend a pelvic exam and a Pap test every three years. For women ages 19-65, they may recommend the Pap test and pelvic exam, combined with testing for human papilloma virus (HPV), every five years. Some types of HPV increase your risk of cervical cancer. Testing for HPV may also be done on women of any age who have unclear Pap test results.  Other health care providers may not recommend any screening for nonpregnant women who are considered low risk for pelvic cancer and have no symptoms. Ask your health care provider if a screening pelvic exam  is right for you.  If you have had past treatment for cervical cancer or a condition that could lead to cancer, you need Pap tests and screening for cancer for at least 20 years after your treatment. If Pap tests have been discontinued for you, your risk factors (such as having a new sexual partner) need to be reassessed to determine if you should start having screenings again. Some women have medical problems that increase the chance of getting cervical cancer. In these cases, your health care provider may recommend that you have screening and Pap tests more often.  If you have a family history of uterine cancer or ovarian cancer, talk with your health care provider about genetic screening.  If you have vaginal bleeding after reaching menopause, tell your health care provider.  There are currently no reliable tests available to screen for ovarian cancer.  Lung Cancer Lung cancer screening is recommended for adults 91-15 years old who are at high risk for lung cancer because of a history of smoking. A yearly low-dose CT scan of the lungs is recommended if you:  Currently smoke.  Have a history of at least 30 pack-years of smoking and you currently smoke or have quit within the past 15 years. A pack-year is smoking an average of one pack  of cigarettes per day for one year.  Yearly screening should:  Continue until it has been 15 years since you quit.  Stop if you develop a health problem that would prevent you from having lung cancer treatment.  Colorectal Cancer  This type of cancer can be detected and can often be prevented.  Routine colorectal cancer screening usually begins at age 53 and continues through age 54.  If you have risk factors for colon cancer, your health care provider may recommend that you be screened at an earlier age.  If you have a family history of colorectal cancer, talk with your health care provider about genetic screening.  Your health care provider may also  recommend using home test kits to check for hidden blood in your stool.  A small camera at the end of a tube can be used to examine your colon directly (sigmoidoscopy or colonoscopy). This is done to check for the earliest forms of colorectal cancer.  Direct examination of the colon should be repeated every 5-10 years until age 58. However, if early forms of precancerous polyps or small growths are found or if you have a family history or genetic risk for colorectal cancer, you may need to be screened more often.  Skin Cancer  Check your skin from head to toe regularly.  Monitor any moles. Be sure to tell your health care provider: ? About any new moles or changes in moles, especially if there is a change in a mole's shape or color. ? If you have a mole that is larger than the size of a pencil eraser.  If any of your family members has a history of skin cancer, especially at a young age, talk with your health care provider about genetic screening.  Always use sunscreen. Apply sunscreen liberally and repeatedly throughout the day.  Whenever you are outside, protect yourself by wearing long sleeves, pants, a wide-brimmed hat, and sunglasses.  What should I know about osteoporosis? Osteoporosis is a condition in which bone destruction happens more quickly than new bone creation. After menopause, you may be at an increased risk for osteoporosis. To help prevent osteoporosis or the bone fractures that can happen because of osteoporosis, the following is recommended:  If you are 49-69 years old, get at least 1,000 mg of calcium and at least 600 mg of vitamin D per day.  If you are older than age 66 but younger than age 15, get at least 1,200 mg of calcium and at least 600 mg of vitamin D per day.  If you are older than age 14, get at least 1,200 mg of calcium and at least 800 mg of vitamin D per day.  Smoking and excessive alcohol intake increase the risk of osteoporosis. Eat foods that are  rich in calcium and vitamin D, and do weight-bearing exercises several times each week as directed by your health care provider. What should I know about how menopause affects my mental health? Depression may occur at any age, but it is more common as you become older. Common symptoms of depression include:  Low or sad mood.  Changes in sleep patterns.  Changes in appetite or eating patterns.  Feeling an overall lack of motivation or enjoyment of activities that you previously enjoyed.  Frequent crying spells.  Talk with your health care provider if you think that you are experiencing depression. What should I know about immunizations? It is important that you get and maintain your immunizations. These include:  Tetanus,  diphtheria, and pertussis (Tdap) booster vaccine.  Influenza every year before the flu season begins.  Pneumonia vaccine.  Shingles vaccine.  Your health care provider may also recommend other immunizations. This information is not intended to replace advice given to you by your health care provider. Make sure you discuss any questions you have with your health care provider. Document Released: 01/31/2006 Document Revised: 06/28/2016 Document Reviewed: 09/12/2015 Elsevier Interactive Patient Education  2018 [employer].

## 2018-05-06 ENCOUNTER — Ambulatory Visit (HOSPITAL_BASED_OUTPATIENT_CLINIC_OR_DEPARTMENT_OTHER)
Admission: RE | Admit: 2018-05-06 | Discharge: 2018-05-06 | Disposition: A | Discharge status: Home / Self Care | Payer: BLUE CROSS/BLUE SHIELD | Source: Ambulatory Visit | Attending: Family Medicine | Admitting: Family Medicine

## 2018-05-06 DIAGNOSIS — Z122 Encounter for screening for malignant neoplasm of respiratory organs: Secondary | ICD-10-CM | POA: Diagnosis not present

## 2018-05-06 DIAGNOSIS — I700 Atherosclerosis of aorta: Secondary | ICD-10-CM | POA: Diagnosis not present

## 2018-05-06 DIAGNOSIS — Z87891 Personal history of nicotine dependence: Secondary | ICD-10-CM | POA: Diagnosis not present

## 2018-05-06 DIAGNOSIS — J432 Centrilobular emphysema: Secondary | ICD-10-CM | POA: Insufficient documentation

## 2018-05-06 NOTE — Addendum Note (Signed)
Addended by: Abbe Amsterdam C on: 05/06/2018 06:31 AM   Modules accepted: Orders

## 2018-05-07 ENCOUNTER — Encounter: Payer: Self-pay | Admitting: Family Medicine

## 2018-05-08 ENCOUNTER — Ambulatory Visit: Payer: BLUE CROSS/BLUE SHIELD | Admitting: Cardiovascular Disease

## 2018-06-01 ENCOUNTER — Other Ambulatory Visit: Payer: Self-pay

## 2018-06-01 DIAGNOSIS — F411 Generalized anxiety disorder: Secondary | ICD-10-CM

## 2018-06-01 MED ORDER — ALPRAZOLAM 0.25 MG PO TABS: ORAL_TABLET | ORAL | 0 refills | Status: DC

## 2018-06-01 NOTE — Telephone Encounter (Signed)
NCCSR- no refill in months, nothing of concern. Ok to refill today

## 2018-06-01 NOTE — Telephone Encounter (Signed)
Requesting:Alprazolam Contract:none, needs csc ZOX:WRUEDS:none, needs uds Last Visit:05/04/18 Next Visit:05/10/19 Last Refill:11/17/17  Please Advise

## 2018-08-26 ENCOUNTER — Encounter: Payer: Self-pay | Admitting: Family Medicine

## 2018-11-08 NOTE — Progress Notes (Signed)
[facility] [address] [place], [place] [address] [phone]  Date:  11/09/2018   Name:  Michele Jacobs   DOB:  Sep 28, 1961   MRN:  [id number]  PCP:  Pearline Cables, MD    Chief Complaint: Shortness of Breath (no energy,cough, taking mucinex,causing shortness of breath when walking,  requesting an inhaler)   History of Present Illness:  Michele Jacobs is a 57 y.o. very pleasant female patient who presents with the following:  Here today with concern of SOB- she has noticed this for about 2 weeks She had noted a "nagging cough" for a month and then for the last 2 weeks any exercise causes her to feel SOB She can even feel SOB at rest She is using mucinex prn  No orthopnea She feels a pain across her back with a deep breath only No fever No hemoptysis The cough that she does have is dry, non productive No LE edema Never had a PE or DVT No recent travel, no cancer history  She was a light smoker but quit a long time ago  No CP or tightness History of MVP and palpations- no other heart problems  Last seen here for a PE in May at which time she described walking for exercise, 2-3 miles a day  NCcSR: 06/03/2018  1   06/01/2018  Alprazolam 0.25 Mg Tablet  30.00 30 Je Cop  [id number]  Har ([id number])  0/0 0.50 LME Comm Ins  [place]  01/07/2018  1   01/06/2018  Hydrocodone-Acetamin 5-325 Mg  20.00 4 Gaetano Net  [id number]  Nor ([id number])  0/0 25.00 MME Comm Ins  [place]  11/20/2017  1   11/17/2017  Alprazolam 0.5 Mg Tablet  30.00 30 Je Cop  [id number]  Har ([id number])  0/0 1.00 LME Comm Ins  [place]  05/26/2017  1   04/30/2017  Alprazolam 0.25 Mg Tablet  30.00 30 Je Cop  [id number]  Har ([id number])  0/0 0.50 LME Comm Ins  [place]  12/04/2016  1   12/02/2016  Alprazolam 0.25 Mg Tablet  30.00 30 Je Cop  [id number]  Har ([id number])  0/0       Patient Active Problem List   Diagnosis Date Noted  . Heart palpitations 04/24/2015  . Osteopenia 04/24/2015  . Mitral valve prolapse  01/20/2014  . Palpitations 01/20/2014    Past Medical History:  Diagnosis Date  . Anxiety   . Mitral valve prolapse     Past Surgical History:  Procedure Laterality Date  . TUBAL LIGATION      Social History   Tobacco Use  . Smoking status: Former Smoker    Packs/day: 0.10    Years: 30.00    Pack years: 3.00    Types: Cigarettes  . Smokeless tobacco: Never Used  . Tobacco comment: quit in 2009  Substance Use Topics  . Alcohol use: No  . Drug use: No    Family History  Problem Relation Age of Onset  . Ovarian cancer Mother   . Cancer Mother   . Lymphoma Father   . Heart disease Father   . Cancer Father   . Non-Hodgkin's lymphoma Father     Allergies  Allergen Reactions  . Codeine Nausea And Vomiting    Medication list has been reviewed and updated.  Current Outpatient Medications on File Prior to Visit  Medication Sig Dispense Refill  . ALPRAZolam (XANAX) 0.25 MG tablet  TAKE ONE TABLET BY MOUTH DAILY AS NEEDED FOR ANXIETY 30 tablet 0  . cholecalciferol (VITAMIN D) 1000 units tablet Take 1,000 Units by mouth daily.    Marland Kitchen erythromycin with ethanol (EMGEL) 2 % gel Apply topically daily. 30 g 1  . TOPROL XL 25 MG 24 hr tablet TAKE ONE TABLET BY MOUTH DAILY 90 tablet 1   No current facility-administered medications on file prior to visit.     Review of Systems:  As per HPI- otherwise negative.   Physical Examination: Vitals:   11/09/18 1317  BP: 112/62  Pulse: 70  Resp: 16  Temp: 97.9 F (36.6 C)  SpO2: 96%   Vitals:   11/09/18 1317  Weight: 123 lb 6.4 oz (56 kg)  Height: 5\' 5"  (1.651 m)   Body mass index is 20.53 kg/m. Ideal Body Weight: Weight in (lb) to have BMI = 25: 149.9  GEN: WDWN, NAD, Non-toxic, A & O x 3, looks well and quite comfortable HEENT: Atraumatic, Normocephalic. Neck supple. No masses, No LAD.  Bilateral TM wnl, oropharynx normal.  PEERL,EOMI.   Ears and Nose: No external deformity. CV: RRR, No M/G/R. No JVD. No thrill.  No extra heart sounds. PULM: CTA B, no wheezes, crackles, rhonchi. No retractions. No resp. distress. No accessory muscle use. ABD: S, NT, ND. No rebound. No HSM. EXTR: No c/c/e.  No calf swelling or tenderness  NEURO Normal gait.  PSYCH: Normally interactive. Conversant. Not depressed or anxious appearing.  Calm demeanor.   EKG: NSR, no ST elevation or depression  C/W tracing from 5/18-no change noted   Dg Chest 2 View  Result Date: 11/09/2018 CLINICAL DATA:  Shortness of breath for 2 weeks. EXAM: CHEST - 2 VIEW COMPARISON:  CT chest May 06, 2018 FINDINGS: Cardiomediastinal silhouette is normal. No pleural effusions or focal consolidations. Trachea projects midline and there is no pneumothorax. Biapical pleuroparenchymal scarring. Soft tissue planes and included osseous structures are non-suspicious. Mild lower thoracic levoscoliosis. IMPRESSION: No acute cardiopulmonary process. Electronically Signed   By: Awilda Metro M.D.   On: 11/09/2018 14:18   She did do a cardiac stress test years ago which was normal- she is not sure why this was done any more, ? Cp It sounds like a nuclear test and she does not wan to do that again, but would do a treadmill  Assessment and Plan: SOB (shortness of breath) - Plan: CBC, Comprehensive metabolic panel, EKG 12-Lead, DG Chest 2 View, B Nat Peptide, Troponin I -, D-Dimer, Quantitative, Ferritin, TSH, albuterol (PROVENTIL HFA;VENTOLIN HFA) 108 (90 Base) MCG/ACT inhaler  Cough  Here today with SOB Reason is not yet clear Plan to do labs as above Discussed a CT angio but she would prefer to do a D dimer first   Signed Abbe [place], MD  Received all of her labs and gave her a call Results for orders placed or performed in visit on 11/09/18  CBC  Result Value Ref Range   WBC 4.4 4.0 - 10.5 K/uL   RBC 4.22 3.87 - 5.11 Mil/uL   Platelets 220.0 150.0 - 400.0 K/uL   Hemoglobin 13.0 12.0 - 15.0 g/dL   HCT 81.1 91.4 - 78.2 %   MCV 92.0 78.0 -  100.0 fl   MCHC 33.6 30.0 - 36.0 g/dL   RDW 95.6 21.3 - 08.6 %  Comprehensive metabolic panel  Result Value Ref Range   Sodium 142 135 - 145 mEq/L   Potassium 3.8 3.5 - 5.1 mEq/L  Chloride 105 96 - 112 mEq/L   CO2 30 19 - 32 mEq/L   Glucose, Bld 87 70 - 99 mg/dL   BUN 15 6 - 23 mg/dL   Creatinine, Ser 1.610.73 0.40 - 1.20 mg/dL   Total Bilirubin 1.0 0.2 - 1.2 mg/dL   Alkaline Phosphatase 84 39 - 117 U/L   AST 15 0 - 37 U/L   ALT 13 0 - 35 U/L   Total Protein 6.5 6.0 - 8.3 g/dL   Albumin 4.4 3.5 - 5.2 g/dL   Calcium 9.3 8.4 - 09.610.5 mg/dL   GFR 04.5487.57 >09.81>60.00 mL/min  B Nat Peptide  Result Value Ref Range   Pro B Natriuretic peptide (BNP) 65.0 0.0 - 100.0 pg/mL  Troponin I -  Result Value Ref Range   TNIDX 0.01 0.00 - 0.06 ug/l  D-Dimer, Quantitative  Result Value Ref Range   D-Dimer, Quant 0.45 <0.50 mcg/mL FEU  Ferritin  Result Value Ref Range   Ferritin 83.3 10.0 - 291.0 ng/mL  TSH  Result Value Ref Range   TSH 1.84 0.35 - 4.50 uIU/mL   All her labs are normal Good news but we don't have a cause of her SOB Will rx albuterol and order an ETT for her As we do not have a reason for her SOB, offered further evaluation at the ED now.  She declines but will seek care right away if any worsening of her symptoms or other concerns

## 2018-11-09 ENCOUNTER — Encounter: Payer: Self-pay | Admitting: Family Medicine

## 2018-11-09 ENCOUNTER — Ambulatory Visit (HOSPITAL_BASED_OUTPATIENT_CLINIC_OR_DEPARTMENT_OTHER)
Admission: RE | Admit: 2018-11-09 | Discharge: 2018-11-09 | Disposition: A | Discharge status: Home / Self Care | Payer: BLUE CROSS/BLUE SHIELD | Source: Ambulatory Visit | Attending: Family Medicine | Admitting: Family Medicine

## 2018-11-09 ENCOUNTER — Ambulatory Visit: Payer: BLUE CROSS/BLUE SHIELD | Admitting: Family Medicine

## 2018-11-09 VITALS — BP 112/62 | HR 70 | Temp 97.9°F | Resp 16 | Ht 65.0 in | Wt 123.4 lb

## 2018-11-09 DIAGNOSIS — R05 Cough: Secondary | ICD-10-CM

## 2018-11-09 DIAGNOSIS — R0602 Shortness of breath: Secondary | ICD-10-CM

## 2018-11-09 DIAGNOSIS — R059 Cough, unspecified: Secondary | ICD-10-CM

## 2018-11-09 LAB — TSH: TSH: 1.84 u[IU]/mL (ref 0.35–4.50)

## 2018-11-09 LAB — CBC
HEMATOCRIT: 38.9 % (ref 36.0–46.0)
Hemoglobin: 13 g/dL (ref 12.0–15.0)
MCHC: 33.6 g/dL (ref 30.0–36.0)
MCV: 92 fl (ref 78.0–100.0)
Platelets: 220 10*3/uL (ref 150.0–400.0)
RBC: 4.22 Mil/uL (ref 3.87–5.11)
RDW: 13.6 % (ref 11.5–15.5)
WBC: 4.4 10*3/uL (ref 4.0–10.5)

## 2018-11-09 LAB — COMPREHENSIVE METABOLIC PANEL
ALK PHOS: 84 U/L (ref 39–117)
ALT: 13 U/L (ref 0–35)
AST: 15 U/L (ref 0–37)
Albumin: 4.4 g/dL (ref 3.5–5.2)
BUN: 15 mg/dL (ref 6–23)
CO2: 30 mEq/L (ref 19–32)
CREATININE: 0.73 mg/dL (ref 0.40–1.20)
Calcium: 9.3 mg/dL (ref 8.4–10.5)
Chloride: 105 mEq/L (ref 96–112)
GFR: 87.57 mL/min (ref >60.00)
Glucose, Bld: 87 mg/dL (ref 70–99)
POTASSIUM: 3.8 meq/L (ref 3.5–5.1)
SODIUM: 142 meq/L (ref 135–145)
TOTAL PROTEIN: 6.5 g/dL (ref 6.0–8.3)
Total Bilirubin: 1 mg/dL (ref 0.2–1.2)

## 2018-11-09 LAB — D-DIMER, QUANTITATIVE: D-Dimer, Quant: 0.45 mcg/mL FEU (ref <0.50)

## 2018-11-09 LAB — FERRITIN: Ferritin: 83.3 ng/mL (ref 10.0–291.0)

## 2018-11-09 LAB — BRAIN NATRIURETIC PEPTIDE: Pro B Natriuretic peptide (BNP): 65 pg/mL (ref 0.0–100.0)

## 2018-11-09 LAB — TROPONIN I: TNIDX: 0.01 ug/l (ref 0.00–0.06)

## 2018-11-09 MED ORDER — ALBUTEROL SULFATE HFA 108 (90 BASE) MCG/ACT IN AERS: 2.0000 | INHALATION_SPRAY | Freq: Four times a day (QID) | RESPIRATORY_TRACT | 0 refills | Status: DC | PRN

## 2018-11-10 ENCOUNTER — Encounter: Payer: Self-pay | Admitting: Family Medicine

## 2018-11-10 DIAGNOSIS — R0602 Shortness of breath: Secondary | ICD-10-CM

## 2018-11-11 DIAGNOSIS — G43B0 Ophthalmoplegic migraine, not intractable: Secondary | ICD-10-CM | POA: Diagnosis not present

## 2018-11-11 MED ORDER — PREDNISONE 20 MG PO TABS: ORAL_TABLET | ORAL | 0 refills | Status: DC

## 2018-12-09 ENCOUNTER — Other Ambulatory Visit: Payer: Self-pay | Admitting: Family Medicine

## 2018-12-09 DIAGNOSIS — F411 Generalized anxiety disorder: Secondary | ICD-10-CM

## 2018-12-10 ENCOUNTER — Other Ambulatory Visit: Payer: Self-pay | Admitting: Family Medicine

## 2018-12-10 DIAGNOSIS — F411 Generalized anxiety disorder: Secondary | ICD-10-CM

## 2018-12-10 MED ORDER — ALPRAZOLAM 0.25 MG PO TABS: ORAL_TABLET | ORAL | 0 refills | Status: DC

## 2018-12-10 NOTE — Telephone Encounter (Signed)
Last seen here last month tox- NA  NCCSR:  06/03/2018  1   06/01/2018  Alprazolam 0.25 Mg Tablet  30.00 30 Je Cop  [id number]  Har ([id number])  0/0 0.50 LME Comm Ins  [place]  01/07/2018  1   01/06/2018  Hydrocodone-Acetamin 5-325 Mg  20.00 4 [place]et[place] NetJo [place]e  [id number]  [place] ([id number])  0/0 25.00 MME Comm Ins  [place]  11/20/2017  1   11/17/2017  Alprazolam 0.5 Mg Tablet  30.00 30 Je Cop  [id number]  Har ([id number])  0/0 1.00 LME Comm Ins  [place]  05/26/2017  1   04/30/2017  Alprazolam 0.25 Mg Tablet  30.00 30 Je Cop  [id number]  Har ([id number])  0/0      Ok to refill xanax, uses rarely

## 2019-02-09 ENCOUNTER — Other Ambulatory Visit: Payer: Self-pay | Admitting: Family Medicine

## 2019-02-09 DIAGNOSIS — R002 Palpitations: Secondary | ICD-10-CM

## 2019-02-09 IMAGING — DX DG CERVICAL SPINE COMPLETE 4+V
6 series · 6 of 6 positions shown · non-contrast
Comparison: None in PACs

CLINICAL DATA: Bilateral neck pain for the past month. Palpable
knot over the thyroid region on the right for the past week.

EXAM:
CERVICAL SPINE - COMPLETE 4+ VIEW

[c-spine lat]
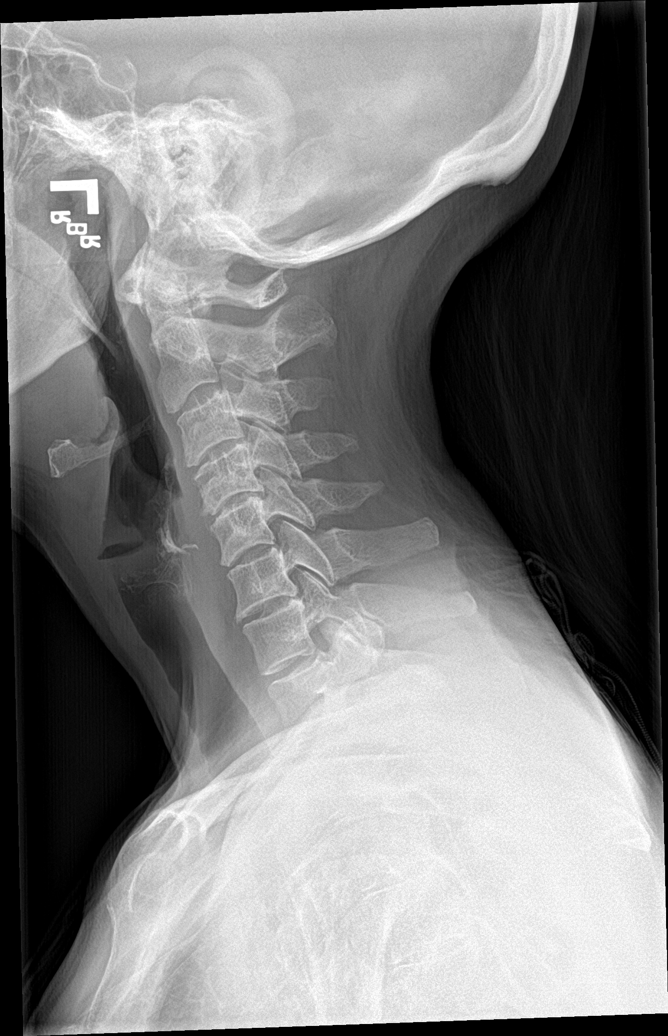

[c-spine obl (1 of 2)]
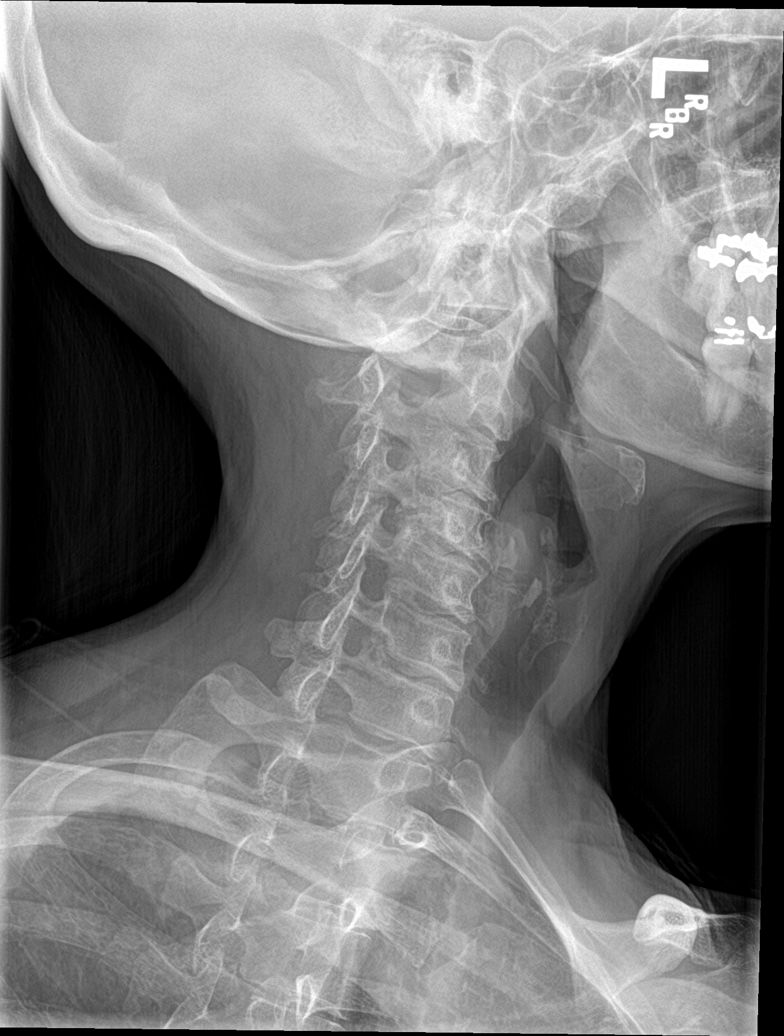

[c-spine obl (2 of 2)]
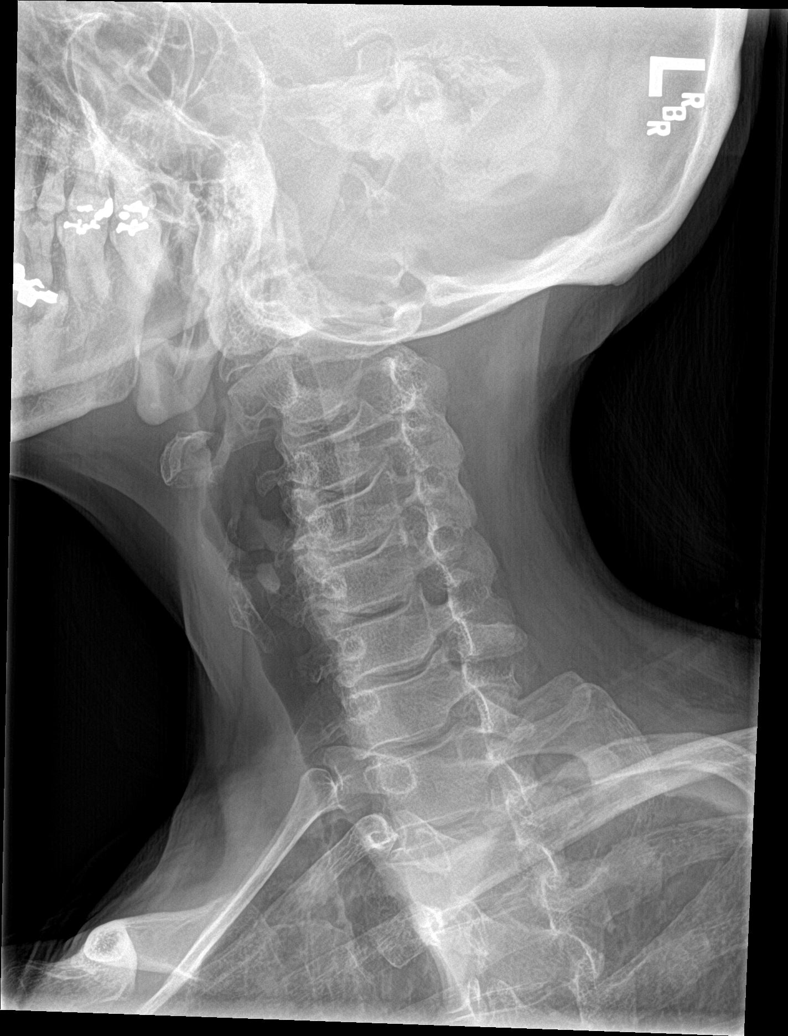

[c-spine ap]
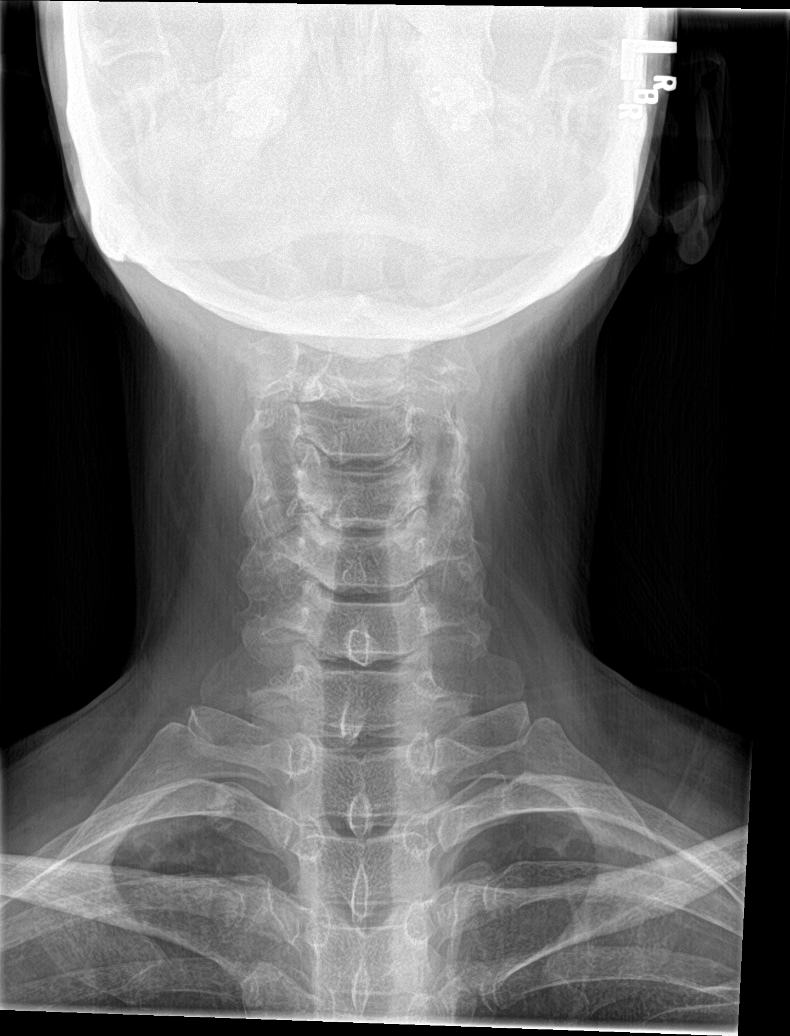

[c-spine open mouth]
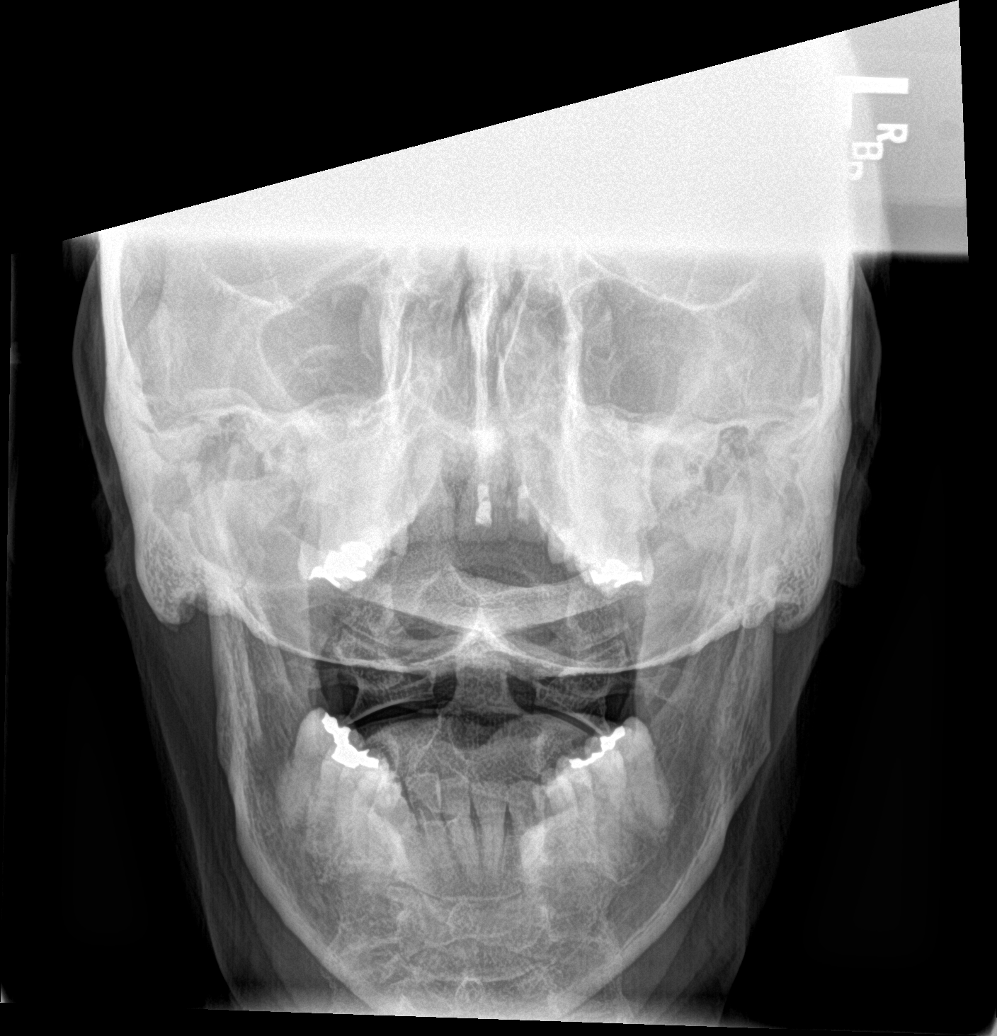

[[person_name]]
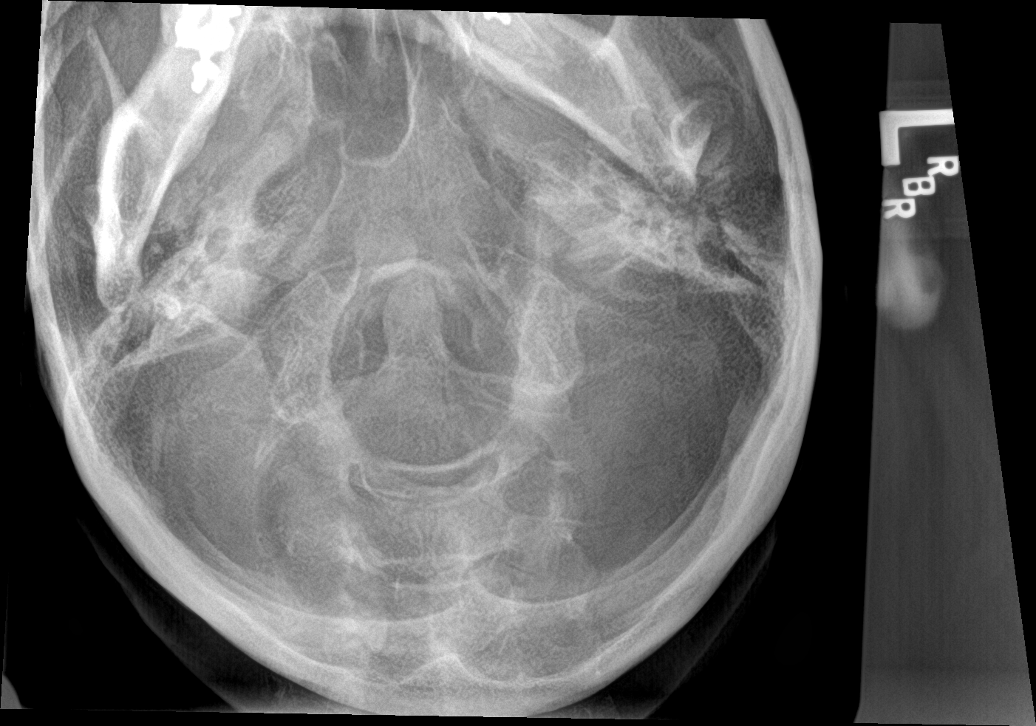

[6 of 6 positions shown; findings below may reference images not displayed]

FINDINGS: The cervical vertebral bodies are preserved in height. There is mild
loss of the normal cervical lordosis. There is mild disc space
narrowing at C3-4, C4-5, and C5-6. There small anterior endplate
osteophytes from C3 through C6. There is no perched facet or spinous
process fracture. The oblique views reveal no high-grade bony
encroachment upon the neural foramina. The odontoid is intact. The
prevertebral soft tissue spaces are normal.
IMPRESSION: There is mild multilevel degenerative disc space narrowing of the
mid cervical spine with smaller in plate osteophytes. No compression
fracture nor significant bony encroachment upon the neural foramina.
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
## 2019-05-10 ENCOUNTER — Other Ambulatory Visit: Payer: Self-pay

## 2019-05-10 ENCOUNTER — Encounter: Payer: Self-pay | Admitting: Family Medicine

## 2019-05-10 ENCOUNTER — Ambulatory Visit (INDEPENDENT_AMBULATORY_CARE_PROVIDER_SITE_OTHER): Payer: BLUE CROSS/BLUE SHIELD | Admitting: Family Medicine

## 2019-05-10 VITALS — BP 106/60 | HR 56 | Temp 97.8°F | Resp 16 | Ht 65.0 in | Wt 120.0 lb

## 2019-05-10 DIAGNOSIS — R002 Palpitations: Secondary | ICD-10-CM

## 2019-05-10 DIAGNOSIS — Z130 Encounter for screening for diseases of the blood and blood-forming organs and certain disorders involving the immune mechanism: Secondary | ICD-10-CM

## 2019-05-10 DIAGNOSIS — Z1239 Encounter for other screening for malignant neoplasm of breast: Secondary | ICD-10-CM

## 2019-05-10 DIAGNOSIS — Z13220 Encounter for screening for lipoid disorders: Secondary | ICD-10-CM | POA: Diagnosis not present

## 2019-05-10 DIAGNOSIS — Z131 Encounter for screening for diabetes mellitus: Secondary | ICD-10-CM

## 2019-05-10 DIAGNOSIS — M8580 Other specified disorders of bone density and structure, unspecified site: Secondary | ICD-10-CM | POA: Diagnosis not present

## 2019-05-10 DIAGNOSIS — Z114 Encounter for screening for human immunodeficiency virus [HIV]: Secondary | ICD-10-CM | POA: Diagnosis not present

## 2019-05-10 DIAGNOSIS — Z0000 Encounter for general adult medical examination without abnormal findings: Secondary | ICD-10-CM

## 2019-05-10 DIAGNOSIS — R0602 Shortness of breath: Secondary | ICD-10-CM

## 2019-05-10 DIAGNOSIS — Z122 Encounter for screening for malignant neoplasm of respiratory organs: Secondary | ICD-10-CM

## 2019-05-10 DIAGNOSIS — Z1211 Encounter for screening for malignant neoplasm of colon: Secondary | ICD-10-CM

## 2019-05-10 DIAGNOSIS — E2839 Other primary ovarian failure: Secondary | ICD-10-CM

## 2019-05-10 DIAGNOSIS — F411 Generalized anxiety disorder: Secondary | ICD-10-CM

## 2019-05-10 LAB — COMPREHENSIVE METABOLIC PANEL
ALT: 18 U/L (ref 0–35)
AST: 19 U/L (ref 0–37)
Albumin: 4.5 g/dL (ref 3.5–5.2)
Alkaline Phosphatase: 84 U/L (ref 39–117)
BUN: 13 mg/dL (ref 6–23)
CO2: 30 mEq/L (ref 19–32)
Calcium: 9.4 mg/dL (ref 8.4–10.5)
Chloride: 104 mEq/L (ref 96–112)
Creatinine, Ser: 0.64 mg/dL (ref 0.40–1.20)
GFR: 95.73 mL/min (ref >60.00)
Glucose, Bld: 76 mg/dL (ref 70–99)
Potassium: 4.2 mEq/L (ref 3.5–5.1)
Sodium: 142 mEq/L (ref 135–145)
Total Bilirubin: 1.4 mg/dL — ABNORMAL HIGH (ref 0.2–1.2)
Total Protein: 6.6 g/dL (ref 6.0–8.3)

## 2019-05-10 LAB — LIPID PANEL
Cholesterol: 179 mg/dL (ref 0–200)
HDL: 75.8 mg/dL (ref >39.00)
LDL Cholesterol: 89 mg/dL (ref 0–99)
NonHDL: 103.29
Total CHOL/HDL Ratio: 2
Triglycerides: 72 mg/dL (ref 0.0–149.0)
VLDL: 14.4 mg/dL (ref 0.0–40.0)

## 2019-05-10 LAB — CBC
HCT: 38.8 % (ref 36.0–46.0)
Hemoglobin: 13.4 g/dL (ref 12.0–15.0)
MCHC: 34.5 g/dL (ref 30.0–36.0)
MCV: 92.2 fl (ref 78.0–100.0)
Platelets: 206 10*3/uL (ref 150.0–400.0)
RBC: 4.2 Mil/uL (ref 3.87–5.11)
RDW: 13.4 % (ref 11.5–15.5)
WBC: 4.4 10*3/uL (ref 4.0–10.5)

## 2019-05-10 LAB — HEMOGLOBIN A1C: Hgb A1c MFr Bld: 5.4 % (ref 4.6–6.5)

## 2019-05-10 MED ORDER — ALPRAZOLAM 0.25 MG PO TABS: ORAL_TABLET | ORAL | 1 refills | Status: DC

## 2019-05-10 MED ORDER — TOPROL XL 25 MG PO TB24: 25.0000 mg | ORAL_TABLET | Freq: Every day | ORAL | 3 refills | Status: DC

## 2019-05-10 MED ORDER — ALBUTEROL SULFATE HFA 108 (90 BASE) MCG/ACT IN AERS: 2.0000 | INHALATION_SPRAY | Freq: Four times a day (QID) | RESPIRATORY_TRACT | 2 refills | Status: DC | PRN

## 2019-05-10 NOTE — Patient Instructions (Signed)
It was great to see you today!   Please go to the lab for a blood draw, and I will be in touch with your results asap We will order Cologurard to screen for colon cancer Also will order a bone density scan to be done this fall  We can do the Shingrix vaccine at your convenience   Health Maintenance for Postmenopausal Women Menopause is a normal process in which your reproductive ability comes to an end. This process happens gradually over a span of months to years, usually between the ages of 87 and 74. Menopause is complete when you have missed 12 consecutive menstrual periods. It is important to talk with your health care provider about some of the most common conditions that affect postmenopausal women, such as heart disease, cancer, and bone loss (osteoporosis). Adopting a healthy lifestyle and getting preventive care can help to promote your health and wellness. Those actions can also lower your chances of developing some of these common conditions. What should I know about menopause? During menopause, you may experience a number of symptoms, such as:  Moderate-to-severe hot flashes.  Night sweats.  Decrease in sex drive.  Mood swings.  Headaches.  Tiredness.  Irritability.  Memory problems.  Insomnia. Choosing to treat or not to treat menopausal changes is an individual decision that you make with your health care provider. What should I know about hormone replacement therapy and supplements? Hormone therapy products are effective for treating symptoms that are associated with menopause, such as hot flashes and night sweats. Hormone replacement carries certain risks, especially as you become older. If you are thinking about using estrogen or estrogen with progestin treatments, discuss the benefits and risks with your health care provider. What should I know about heart disease and stroke? Heart disease, heart attack, and stroke become more likely as you age. This may be due,  in part, to the hormonal changes that your body experiences during menopause. These can affect how your body processes dietary fats, triglycerides, and cholesterol. Heart attack and stroke are both medical emergencies. There are many things that you can do to help prevent heart disease and stroke:  Have your blood pressure checked at least every 1-2 years. High blood pressure causes heart disease and increases the risk of stroke.  If you are 5-21 years old, ask your health care provider if you should take aspirin to prevent a heart attack or a stroke.  Do not use any tobacco products, including cigarettes, chewing tobacco, or electronic cigarettes. If you need help quitting, ask your health care provider.  It is important to eat a healthy diet and maintain a healthy weight. ? Be sure to include plenty of vegetables, fruits, low-fat dairy products, and lean protein. ? Avoid eating foods that are high in solid fats, added sugars, or salt (sodium).  Get regular exercise. This is one of the most important things that you can do for your health. ? Try to exercise for at least 150 minutes each week. The type of exercise that you do should increase your heart rate and make you sweat. This is known as moderate-intensity exercise. ? Try to do strengthening exercises at least twice each week. Do these in addition to the moderate-intensity exercise.  Know your numbers.Ask your health care provider to check your cholesterol and your blood glucose. Continue to have your blood tested as directed by your health care provider.  What should I know about cancer screening? There are several types of cancer. Take  the following steps to reduce your risk and to catch any cancer development as early as possible. Breast Cancer  Practice breast self-awareness. ? This means understanding how your breasts normally appear and feel. ? It also means doing regular breast self-exams. Let your health care provider know  about any changes, no matter how small.  If you are 71 or older, have a clinician do a breast exam (clinical breast exam or CBE) every year. Depending on your age, family history, and medical history, it may be recommended that you also have a yearly breast X-ray (mammogram).  If you have a family history of breast cancer, talk with your health care provider about genetic screening.  If you are at high risk for breast cancer, talk with your health care provider about having an MRI and a mammogram every year.  Breast cancer (BRCA) gene test is recommended for women who have family members with BRCA-related cancers. Results of the assessment will determine the need for genetic counseling and BRCA1 and for BRCA2 testing. BRCA-related cancers include these types: ? Breast. This occurs in males or females. ? Ovarian. ? Tubal. This may also be called fallopian tube cancer. ? Cancer of the abdominal or pelvic lining (peritoneal cancer). ? Prostate. ? Pancreatic. Cervical, Uterine, and Ovarian Cancer Your health care provider may recommend that you be screened regularly for cancer of the pelvic organs. These include your ovaries, uterus, and vagina. This screening involves a pelvic exam, which includes checking for microscopic changes to the surface of your cervix (Pap test).  For women ages 21-65, health care providers may recommend a pelvic exam and a Pap test every three years. For women ages 18-65, they may recommend the Pap test and pelvic exam, combined with testing for human papilloma virus (HPV), every five years. Some types of HPV increase your risk of cervical cancer. Testing for HPV may also be done on women of any age who have unclear Pap test results.  Other health care providers may not recommend any screening for nonpregnant women who are considered low risk for pelvic cancer and have no symptoms. Ask your health care provider if a screening pelvic exam is right for you.  If you have had  past treatment for cervical cancer or a condition that could lead to cancer, you need Pap tests and screening for cancer for at least 20 years after your treatment. If Pap tests have been discontinued for you, your risk factors (such as having a new sexual partner) need to be reassessed to determine if you should start having screenings again. Some women have medical problems that increase the chance of getting cervical cancer. In these cases, your health care provider may recommend that you have screening and Pap tests more often.  If you have a family history of uterine cancer or ovarian cancer, talk with your health care provider about genetic screening.  If you have vaginal bleeding after reaching menopause, tell your health care provider.  There are currently no reliable tests available to screen for ovarian cancer. Lung Cancer Lung cancer screening is recommended for adults 69-85 years old who are at high risk for lung cancer because of a history of smoking. A yearly low-dose CT scan of the lungs is recommended if you:  Currently smoke.  Have a history of at least 30 pack-years of smoking and you currently smoke or have quit within the past 15 years. A pack-year is smoking an average of one pack of cigarettes per day for  one year. Yearly screening should:  Continue until it has been 15 years since you quit.  Stop if you develop a health problem that would prevent you from having lung cancer treatment. Colorectal Cancer  This type of cancer can be detected and can often be prevented.  Routine colorectal cancer screening usually begins at age 70 and continues through age 76.  If you have risk factors for colon cancer, your health care provider may recommend that you be screened at an earlier age.  If you have a family history of colorectal cancer, talk with your health care provider about genetic screening.  Your health care provider may also recommend using home test kits to check  for hidden blood in your stool.  A small camera at the end of a tube can be used to examine your colon directly (sigmoidoscopy or colonoscopy). This is done to check for the earliest forms of colorectal cancer.  Direct examination of the colon should be repeated every 5-10 years until age 33. However, if early forms of precancerous polyps or small growths are found or if you have a family history or genetic risk for colorectal cancer, you may need to be screened more often. Skin Cancer  Check your skin from head to toe regularly.  Monitor any moles. Be sure to tell your health care provider: ? About any new moles or changes in moles, especially if there is a change in a mole's shape or color. ? If you have a mole that is larger than the size of a pencil eraser.  If any of your family members has a history of skin cancer, especially at a young age, talk with your health care provider about genetic screening.  Always use sunscreen. Apply sunscreen liberally and repeatedly throughout the day.  Whenever you are outside, protect yourself by wearing long sleeves, pants, a wide-brimmed hat, and sunglasses. What should I know about osteoporosis? Osteoporosis is a condition in which bone destruction happens more quickly than new bone creation. After menopause, you may be at an increased risk for osteoporosis. To help prevent osteoporosis or the bone fractures that can happen because of osteoporosis, the following is recommended:  If you are 78-67 years old, get at least 1,000 mg of calcium and at least 600 mg of vitamin D per day.  If you are older than age 92 but younger than age 69, get at least 1,200 mg of calcium and at least 600 mg of vitamin D per day.  If you are older than age 10, get at least 1,200 mg of calcium and at least 800 mg of vitamin D per day. Smoking and excessive alcohol intake increase the risk of osteoporosis. Eat foods that are rich in calcium and vitamin D, and do  weight-bearing exercises several times each week as directed by your health care provider. What should I know about how menopause affects my mental health? Depression may occur at any age, but it is more common as you become older. Common symptoms of depression include:  Low or sad mood.  Changes in sleep patterns.  Changes in appetite or eating patterns.  Feeling an overall lack of motivation or enjoyment of activities that you previously enjoyed.  Frequent crying spells. Talk with your health care provider if you think that you are experiencing depression. What should I know about immunizations? It is important that you get and maintain your immunizations. These include:  Tetanus, diphtheria, and pertussis (Tdap) booster vaccine.  Influenza every year before  the flu season begins.  Pneumonia vaccine.  Shingles vaccine. Your health care provider may also recommend other immunizations. This information is not intended to replace advice given to you by your health care provider. Make sure you discuss any questions you have with your health care provider. Document Released: 01/31/2006 Document Revised: 06/28/2016 Document Reviewed: 09/12/2015 Elsevier Interactive Patient Education  2019 [employer].

## 2019-05-11 LAB — HIV ANTIBODY (ROUTINE TESTING W REFLEX): HIV 1&2 Ab, 4th Generation: NONREACTIVE

## 2019-05-20 ENCOUNTER — Encounter: Payer: Self-pay | Admitting: Family Medicine

## 2019-05-20 DIAGNOSIS — Z1212 Encounter for screening for malignant neoplasm of rectum: Secondary | ICD-10-CM | POA: Diagnosis not present

## 2019-05-20 DIAGNOSIS — R002 Palpitations: Secondary | ICD-10-CM

## 2019-05-20 DIAGNOSIS — Z1211 Encounter for screening for malignant neoplasm of colon: Secondary | ICD-10-CM | POA: Diagnosis not present

## 2019-05-21 ENCOUNTER — Ambulatory Visit: Payer: BLUE CROSS/BLUE SHIELD

## 2019-05-21 ENCOUNTER — Other Ambulatory Visit: Payer: Self-pay

## 2019-05-21 DIAGNOSIS — Z87891 Personal history of nicotine dependence: Secondary | ICD-10-CM | POA: Diagnosis not present

## 2019-05-21 DIAGNOSIS — Z122 Encounter for screening for malignant neoplasm of respiratory organs: Secondary | ICD-10-CM

## 2019-05-21 DIAGNOSIS — Z136 Encounter for screening for cardiovascular disorders: Secondary | ICD-10-CM | POA: Diagnosis not present

## 2019-05-23 ENCOUNTER — Encounter: Payer: Self-pay | Admitting: Family Medicine

## 2019-05-24 ENCOUNTER — Telehealth: Payer: Self-pay | Admitting: Cardiology

## 2019-05-24 NOTE — Telephone Encounter (Signed)
Patient requested to see Dr. Dulce Sellar in the [facility] office and to have an actual office visit and not virutal. Patient was informed to wear mask and to call office upon arrival to the office. [facility]

## 2019-05-25 NOTE — Telephone Encounter (Signed)
Patient is scheduled for a new patient appointment with you on 06/01/2019 at 1:40 pm. Please advise if patient should be seen virtually or in the office. Thanks!

## 2019-05-26 LAB — COLOGUARD: Cologuard: NEGATIVE

## 2019-05-27 ENCOUNTER — Encounter: Payer: Self-pay | Admitting: *Deleted

## 2019-05-31 ENCOUNTER — Encounter: Payer: Self-pay | Admitting: Family Medicine

## 2019-05-31 NOTE — Progress Notes (Signed)
Cardiology Office Note:    Date:  06/01/2019   ID:  Michele NordmannJanice Y Furth, DOB May 15, 1961, MRN [id number]  PCP:  Pearline Cablesopland, [name], MD  Cardiologist:  Norman HerrlichBrian Darik Massing, MD   Referring MD: Pearline Cablesopland, [name], MD  ASSESSMENT:    1. Palpitations   2. Coronary artery calcification   3. Chronic obstructive pulmonary disease, unspecified COPD type (HCC)   4. Aortic atherosclerosis (HCC)   5. Screening cholesterol level    PLAN:    In order of problems listed above:  1. Palpitations stable continue her beta-blocker I do not think we need to do an ambulatory heart rhythm monitor at this time and I gave her instructions for avoiding over-the-counter proarrhythmic drugs 2. Coronary artery calcification subclinical coronary disease I asked her to start taking aspirin 81 mg daily her blood pressure is optimal and start a low-dose of a low intensity statin for class effect follow-up lipid profile liver function in 1 month.  In the absence of angina I would not pursue a ischemic evaluation at this time. 3. Stable former smoker continue regular exercise program 4. See #2 5. Follow-up lipids 1 month  Next appointment 1 yr   Medication Adjustments/Labs and Tests Ordered: Current medicines are reviewed at length with the patient today.  Concerns regarding medicines are outlined above.  Orders Placed This Encounter  Procedures  . Comprehensive Metabolic Panel (CMET)  . Lipid Profile  . Lipoprotein A (LPA)  . EKG 12-Lead   Meds ordered this encounter  Medications  . aspirin EC 81 MG tablet    Sig: Take 1 tablet (81 mg total) by mouth daily.    Dispense:  90 tablet    Refill:  3  . pravastatin (PRAVACHOL) 20 MG tablet    Sig: Take 1 tablet (20 mg total) by mouth every evening.    Dispense:  90 tablet    Refill:  3     Chief Complaint  Patient presents with  . Mitral Valve Prolapse    History of Present Illness:    Michele Jacobs is a 58 y.o. female who is being seen today for the  evaluation of mitral valve prolapse at the request of [name], [name], MD.  She has a history of palpitations, mitral valve prolapse with minimal mitral regurgitation, and has experienced episodic palpitations. In 2009 she had developed some episodes of chest discomfort with atypical features. A Myoview scan was negative for ischemia. Her last echo Doppler study was in July 2013 which showed normal LV size and function. There was borderline mitral valve prolapse with trace MR. Mild TR. She had normal pulmonary pressures.   She is seen today for 2 reasons the first is history of mitral valve prolapse palpitation and takes a beta-blocker the second is that she had a screening CT for lung cancer is a former smoker and has coronary aortic atherosclerosis.  Palpitation is infrequent not severe sustained tolerates her beta-blocker and will continue the same she will avoid over-the-counter proarrhythmic drugs.  She is concerned because the CT scan showed the presence of emphysema and vascular calcification she is very active is employed full-time cares for her father at home is dying of lymphoma and exercises daily using a Bowflex and a treadmill.  She has mild exertional dyspnea beyond her exercise routine no edema orthopnea chest pain palpitation or syncope.  We discussed cardiovascular prophylaxis I asked her to start using aspirin 81 daily and despite a lipid profile that is not abnormal to  take a low-dose of a low intensity statin with her vascular calcification.  We discussed stress testing and neither of us feel that is indicated at this time in the absence of symptoms.  Her EKG in my office today is sinus rhythm and normal.  She has no history of congenital rheumatic heart disease  Past Medical History:  Diagnosis Date  . Anxiety   . Anxiety state 04/02/2013   Overview:  STORY: See history dated 01/19/2010`E1o3L`IMPRESSION: Continue medications as prescribed.  Relaxation techniques described.  Using  Xanax prn.  May call for refills.  Using xanax 1/2 tablet qHS prn.  Last Assessment & Plan:  Worsening since her father became terminally ill. History of the same with similar presentation. She does not want to go back on Paxil which may have helped in the pa  . 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  Repeat 2014`E1o3L`IMPRESSION: Continue calcium 1200mg  daily with vitamin D.  . Ear pain, bilateral 08/22/2014   Last Assessment & Plan:  May be related to eustachian tube dysfunction. Advised antihistamine-decongestant and Flonase Daily for the next 10-14 days. If symptoms do not improve, or if they worsen, notify me.  . Increased frequency of urination 08/12/2012  . 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 Mitral valve prolapse   . Muscle cramps 08/22/2014   Last Assessment & Plan:  Relative dehydration possibly. Labs ordered. Advised trial of magnesium supplement, 400-800 milligrams nightly over the next few weeks.  . Osteopenia 04/24/2015   Per dexa 03/2015, 10/18  . Palpitations 01/20/2014  . 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 Tobacco use disorder 04/02/2013   Overview:  IMPRESSION: Actively trying to quit.  The goal for anyone who smokes is to decrease the amount smoked each day and then eventually quit.  Stop smoking groups, printed information and medications are all available for your use when you decide to stop. She ONLY smokes if she has had quite a few drinks. jmw    Past Surgical History:  Procedure Laterality Date  . THYROGLOSSAL DUCT CYST  01/2018   removal  . TUBAL LIGATION      Current Medications: Current Meds  Medication Sig  . albuterol (VENTOLIN HFA) 108 (90 Base) MCG/ACT inhaler Inhale 2 puffs into the lungs every 6 (six) hours as needed for wheezing or shortness of breath.  . ALPRAZolam (XANAX) 0.25 MG tablet TAKE ONE TABLET BY MOUTH DAILY AS NEEDED FOR ANXIETY  . cholecalciferol (VITAMIN D) 1000  units tablet Take 1,000 Units by mouth daily.  Marland Kitchen. erythromycin with ethanol (EMGEL) 2 % gel Apply topically daily.  . TOPROL XL 25 MG 24 hr tablet Take 1 tablet (25 mg total) by mouth daily.     Allergies:   Codeine   Social History   Socioeconomic History  . Marital status: Single    Spouse name: Not on file  . Number of children: Not on file  . Years of education: Not on file  . Highest education level: Not on file  Occupational History  . Occupation: bookkeeper  Engineer, productionocial Needs  . Financial resource strain: Not on file  . Food insecurity:    Worry: Not on file    Inability: Not on file  . Transportation needs:    Medical: Not on file    Non-medical: Not on file  Tobacco Use  . Smoking status: Former Smoker    Packs/day: 0.10    Years: 30.00  Pack years: 3.00    Types: Cigarettes  . Smokeless tobacco: Never Used  . Tobacco comment: quit in 2009  Substance and Sexual Activity  . Alcohol use: No  . Drug use: No  . Sexual activity: Never    Birth control/protection: Surgical  Lifestyle  . Physical activity:    Days per week: Not on file    Minutes per session: Not on file  . Stress: Not on file  Relationships  . Social connections:    Talks on phone: Not on file    Gets together: Not on file    Attends religious service: Not on file    Active member of club or organization: Not on file    Attends meetings of clubs or organizations: Not on file    Relationship status: Not on file  Other Topics Concern  . Not on file  Social History Narrative   Lives alone;   No children   Bookkeeper     Family History: The patient's family history includes Atrial fibrillation in her father; Cancer in her father and mother; Heart disease in her father; Lymphoma in her father; Non-Hodgkin's lymphoma in her father; Obesity in her sister; Ovarian cancer in her mother.  ROS:   Review of Systems  Constitution: Negative.  HENT: Negative.   Eyes: Negative.   Cardiovascular:  Positive for palpitations.  Respiratory: Positive for cough and shortness of breath.   Endocrine: Negative.   Hematologic/Lymphatic: Negative.   Skin: Negative.   Musculoskeletal: Negative.   Gastrointestinal: Negative.   Genitourinary: Negative.   Neurological: Negative.   Psychiatric/Behavioral: Negative.   Allergic/Immunologic: Negative.    Please see the history of present illness.     All other systems reviewed and are negative.  EKGs/Labs/Other Studies Reviewed:    The following studies were reviewed today:   EKG:  EKG is  ordered today.  The ekg ordered today is personally reviewed and demonstrates [place] and normal  Recent Labs: 11/09/2018: Pro B Natriuretic peptide (BNP) 65.0; TSH 1.84 05/10/2019: ALT 18; BUN 13; Creatinine, Ser 0.64; Hemoglobin 13.4; Platelets 206.0; Potassium 4.2; Sodium 142  Recent Lipid Panel    Component Value Date/Time   CHOL 179 05/10/2019 0904   TRIG 72.0 05/10/2019 0904   HDL 75.80 05/10/2019 0904   CHOLHDL 2 05/10/2019 0904   VLDL 14.4 05/10/2019 0904   LDLCALC 89 05/10/2019 0904    Physical Exam:    VS:  BP 118/60 (BP Location: Left Arm, Patient Position: Sitting, Cuff Size: Normal)   Pulse 66   Temp (!) 93.6 F (34.2 C)   Ht 5\' 5"  (1.651 m)   Wt 120 lb 6.4 oz (54.6 kg)   SpO2 100%   BMI 20.04 kg/m     Wt Readings from Last 3 Encounters:  06/01/19 120 lb 6.4 oz (54.6 kg)  05/10/19 120 lb (54.4 kg)  11/09/18 123 lb 6.4 oz (56 kg)     GEN:  Well nourished, well developed in no acute distress HEENT: Normal NECK: No JVD; No carotid bruits LYMPHATICS: No lymphadenopathy CARDIAC: RRR, no murmurs, rubs, gallops she has early to midsystolic clicks RESPIRATORY:  Clear to auscultation without rales, wheezing or rhonchi  ABDOMEN: Soft, non-tender, non-distended MUSCULOSKELETAL:  No edema; No deformity  SKIN: Warm and dry NEUROLOGIC:  Alert and oriented x 3 PSYCHIATRIC:  Normal affect     Signed, Shirlee More, MD  06/01/2019  2:12 PM    [facility]

## 2019-06-01 ENCOUNTER — Other Ambulatory Visit: Payer: Self-pay

## 2019-06-01 ENCOUNTER — Encounter: Payer: Self-pay | Admitting: Family Medicine

## 2019-06-01 ENCOUNTER — Ambulatory Visit: Payer: BC Managed Care – PPO | Admitting: Cardiology

## 2019-06-01 ENCOUNTER — Encounter: Payer: Self-pay | Admitting: Cardiology

## 2019-06-01 VITALS — BP 118/60 | HR 66 | Temp 93.6°F | Ht 65.0 in | Wt 120.4 lb

## 2019-06-01 DIAGNOSIS — R002 Palpitations: Secondary | ICD-10-CM

## 2019-06-01 DIAGNOSIS — I2510 Atherosclerotic heart disease of native coronary artery without angina pectoris: Secondary | ICD-10-CM | POA: Diagnosis not present

## 2019-06-01 DIAGNOSIS — I2584 Coronary atherosclerosis due to calcified coronary lesion: Secondary | ICD-10-CM

## 2019-06-01 DIAGNOSIS — J449 Chronic obstructive pulmonary disease, unspecified: Secondary | ICD-10-CM

## 2019-06-01 DIAGNOSIS — I700 Atherosclerosis of aorta: Secondary | ICD-10-CM

## 2019-06-01 DIAGNOSIS — Z13220 Encounter for screening for lipoid disorders: Secondary | ICD-10-CM

## 2019-06-01 MED ORDER — PRAVASTATIN SODIUM 20 MG PO TABS: 20.0000 mg | ORAL_TABLET | Freq: Every evening | ORAL | 3 refills | Status: DC

## 2019-06-01 MED ORDER — ASPIRIN EC 81 MG PO TBEC: 81.0000 mg | DELAYED_RELEASE_TABLET | Freq: Every day | ORAL | 3 refills | Status: AC

## 2019-06-01 NOTE — Patient Instructions (Addendum)
Medication Instructions:  Your physician has recommended you make the following change in your medication:   START pravastatin 20 mg: Take 1 tablet daily in the evening START aspirin 81 mg: Take 1 tablet daily  If you need a refill on your cardiac medications before your next appointment, please call your pharmacy.   Lab work: Your physician recommends that you return for lab work in 1 month: CMP, lipid panel, lipoprotein A (LPa). Please [facility] our [facility] office for lab work, no appointment needed. Please fast beforehand.   If you have labs (blood work) drawn today and your tests are completely normal, you will receive your results only by: Marland Kitchen. MyChart Message (if you have MyChart) OR . A paper copy in the mail If you have any lab test that is abnormal or we need to change your treatment, we will call you to review the results.  Testing/Procedures: You had an EKG today.   Follow-Up: At [facility], you and your health needs are our priority.  As part of our continuing mission to provide you with exceptional heart care, we have created designated Provider Care Teams.  These Care Teams include your primary Cardiologist (physician) and Advanced Practice Providers (APPs -  Physician Assistants and Nurse Practitioners) who all work together to provide you with the care you need, when you need it. You will need a follow up appointment in 1 years.       1. Avoid all over-the-counter antihistamines except Claritin/Loratadine and Zyrtec/Cetrizine. 2. Avoid all combination including cold sinus allergies flu decongestant and sleep medications 3. You can use Robitussin DM Mucinex and Mucinex DM for cough. 4. can use Tylenol aspirin ibuprofen and naproxen but no combinations such as sleep or sinus.      Pravastatin tablets What is this medicine? PRAVASTATIN (PRA va stat in) is known as a HMG-CoA reductase inhibitor or 'statin'. It lowers the level of cholesterol and triglycerides in  the blood. This drug may also reduce the risk of heart attack, stroke, or other health problems in patients with risk factors for heart disease. Diet and lifestyle changes are often used with this drug. This medicine may be used for other purposes; ask your health care provider or pharmacist if you have questions. COMMON BRAND NAME(S): Pravachol What should I tell my health care provider before I take this medicine? They need to know if you have any of these conditions: -diabetes -if you often drink alcohol -history of stroke -kidney disease -liver disease -muscle aches or weakness -thyroid disease -an unusual or allergic reaction to pravastatin, other medicines, foods, dyes, or preservatives -pregnant or trying to get pregnant -breast-feeding How should I use this medicine? Take pravastatin tablets by mouth. Swallow the tablets with a drink of water. Pravastatin can be taken at anytime of the day, with or without food. Follow the directions on the prescription label. Take your doses at regular intervals. Do not take your medicine more often than directed. Talk to your pediatrician regarding the use of this medicine in children. Special care may be needed. Pravastatin has been used in children as young as 138 years of age. Overdosage: If you think you have taken too much of this medicine contact a poison control center or emergency room at once. NOTE: This medicine is only for you. Do not share this medicine with others. What if I miss a dose? If you miss a dose, take it as soon as you can. If it is almost time for your  next dose, take only that dose. Do not take double or extra doses. What may interact with this medicine? This medicine may interact with the following medications: -colchicine -cyclosporine -other medicines for high cholesterol -some antibiotics like azithromycin, clarithromycin, erythromycin, and telithromycin This list may not describe all possible interactions. Give your  health care provider a list of all the medicines, herbs, non-prescription drugs, or dietary supplements you use. Also tell them if you smoke, drink alcohol, or use illegal drugs. Some items may interact with your medicine. What should I watch for while using this medicine? Visit your doctor or health care professional for regular check-ups. You may need regular tests to make sure your liver is working properly. Your health care professional may tell you to stop taking this medicine if you develop muscle problems. If your muscle problems do not go away after stopping this medicine, contact your health care professional. Do not become pregnant while taking this medicine. Women should inform their health care professional if they wish to become pregnant or think they might be pregnant. There is a potential for serious side effects to an unborn child. Talk to your health care professional or pharmacist for more information. Do not breast-feed an infant while taking this medicine. This medicine may affect blood sugar levels. If you have diabetes, check with your doctor or health care professional before you change your diet or the dose of your diabetic medicine. If you are going to need surgery or other procedure, tell your doctor that you are using this medicine. This drug is only part of a total heart-health program. Your doctor or a dietician can suggest a low-cholesterol and low-fat diet to help. Avoid alcohol and smoking, and keep a proper exercise schedule. This medicine may cause a decrease in Co-Enzyme Q-10. You should make sure that you get enough Co-Enzyme Q-10 while you are taking this medicine. Discuss the foods you eat and the vitamins you take with your health care professional. What side effects may I notice from receiving this medicine? Side effects that you should report to your doctor or health care professional as soon as possible: -allergic reactions like skin rash, itching or hives,  swelling of the face, lips, or tongue -dark urine -fever -muscle pain, cramps, or weakness -redness, blistering, peeling or loosening of the skin, including inside the mouth -trouble passing urine or change in the amount of urine -unusually weak or tired -yellowing of the eyes or skin Side effects that usually do not require medical attention (report to your doctor or health care professional if they continue or are bothersome): -gas -headache -heartburn -indigestion -stomach pain This list may not describe all possible side effects. Call your doctor for medical advice about side effects. You may report side effects to FDA at 1-800-FDA-1088. Where should I keep my medicine? Keep out of the reach of children. Store at room temperature between 15 to 30 degrees C (59 to 86 degrees F). Protect from light. Keep container tightly closed. Throw away any unused medicine after the expiration date. NOTE: This sheet is a summary. It may not cover all possible information. If you have questions about this medicine, talk to your doctor, pharmacist, or health care provider.  2019 Elsevier/Gold Standard (2017-08-12 12:37:09)       Aspirin and Your Heart  Aspirin is a medicine that prevents the cells in the blood that are used for clotting, called platelets, from sticking together. Aspirin can be used to help reduce the risk of blood clots,  heart attacks, and other heart-related problems. Can I take aspirin? Your health care provider will help you determine whether it is safe and beneficial for you to take aspirin daily. Taking aspirin daily may be helpful if you:  Have had a heart attack or chest pain.  Are at risk for a heart attack.  Have undergone open-heart surgery, such as coronary artery bypass surgery (CABG).  Have had coronary angioplasty or a stent.  Have had certain types of stroke or transient ischemic attack (TIA).  Have peripheral artery disease (PAD).  Have chronic heart  rhythm problems such as atrial fibrillation and cannot take an anticoagulant.  Have valve disease or have had surgery on a valve. What are the risks? Daily use of aspirin can cause side effects. Some of these include:  Bleeding. Bleeding problems can be minor or serious. An example of a minor problem is a cut that does not stop bleeding. An example of a more serious problem is stomach bleeding or, rarely, bleeding into the brain. Your risk of bleeding is increased if you are also taking non-steroidal anti-inflammatory drugs (NSAIDs).  Increased bruising.  Upset stomach.  An allergic reaction. People who have nasal polyps have an increased risk of developing an aspirin allergy. General guidelines  Take aspirin only as told by your health care provider. Make sure that you understand how much you should take and what form you should take. The two forms of aspirin are: ? Non-enteric-coated.This type of aspirin does not have a coating and is absorbed quickly. This type of aspirin also comes in a chewable form. ? Enteric-coated. This type of aspirin has a coating that releases the medicine very slowly. Enteric-coated aspirin might cause less stomach upset than non-enteric-coated aspirin. This type of aspirin should not be chewed or crushed.  Limit alcohol intake to no more than 1 drink a day for nonpregnant women and 2 drinks a day for men. Drinking alcohol increases your risk of bleeding. One drink equals 12 oz of beer, 5 oz of wine, or 1 oz of hard liquor. Contact a health care provider if you:  Have unusual bleeding or bruising.  Have stomach pain or nausea.  Have ringing in your ears.  Have an allergic reaction that causes: ? Hives. ? Itchy skin. ? Swelling of the lips, tongue, or face. Get help right away if you:  Notice that your bowel movements are bloody, dark red, or black in color.  Vomit or cough up blood.  Have blood in your urine.  Cough, have noisy breathing (wheeze),  or feel short of breath.  Have chest pain, especially if the pain spreads to the arms, back, neck, or jaw.  Have a severe headache, or a headache with confusion, or dizziness. These symptoms may represent a serious problem that is an emergency. Do not wait to see if the symptoms will go away. Get medical help right away. Call your local emergency services (911 in the [place] Do not drive yourself to the hospital. Summary  Aspirin can be used to help reduce the risk of blood clots, heart attacks, and other heart-related problems.  Daily use of aspirin can increase your risk of side effects. Your health care provider will help you determine whether it is safe and beneficial for you to take aspirin daily.  Take aspirin only as told by your health care provider. Make sure that you understand how much you can take and what form you can take. This information is not intended to replace  advice given to you by your health care provider. Make sure you discuss any questions you have with your health care provider. Document Released: 11/21/2008 Document Revised: 10/09/2017 Document Reviewed: 10/09/2017 Elsevier Interactive Patient Education  2019 [employer]

## 2019-06-07 ENCOUNTER — Telehealth: Payer: Self-pay | Admitting: Cardiology

## 2019-06-07 NOTE — Telephone Encounter (Signed)
She may stop the pravastatin. If her symptoms do not improve or worsen ask her to please call us back. Thank you!

## 2019-06-07 NOTE — Telephone Encounter (Addendum)
Reports developing brief, intermittent shooting chest pains and heartburn throughout the day that began the day after starting pravastatin 06/02/19. Previous chest pain she's had felt different like someone was stepping on her chest.   Denies new or changed shortness of breath, swelling or weight gain. She does use inhalers prn at baseline for COPD. Does not check VS or weigh herself at home.     pls advise, tx

## 2019-06-07 NOTE — Telephone Encounter (Signed)
Phoned patient, instructed to stop taking pravastatin and to call office if symptoms do not improve. She verbalizes understanding and has no further questions or concerns.

## 2019-06-07 NOTE — Addendum Note (Signed)
Addended by: Polly Cobia A on: 06/07/2019 09:33 AM   Modules accepted: Orders

## 2019-06-07 NOTE — Telephone Encounter (Signed)
Patient was given pravastatin last week and she calls stating it  is making her feel bad.  It makes her have chest pain and heart burn.  Please  Call patient to discuss [phone]

## 2019-06-08 ENCOUNTER — Emergency Department (HOSPITAL_BASED_OUTPATIENT_CLINIC_OR_DEPARTMENT_OTHER): Payer: BC Managed Care – PPO

## 2019-06-08 ENCOUNTER — Other Ambulatory Visit: Payer: Self-pay

## 2019-06-08 ENCOUNTER — Emergency Department (HOSPITAL_BASED_OUTPATIENT_CLINIC_OR_DEPARTMENT_OTHER)
Admission: EM | Admit: 2019-06-08 | Discharge: 2019-06-08 | Disposition: A | Discharge status: Home / Self Care | Payer: BC Managed Care – PPO | Attending: Emergency Medicine | Admitting: Emergency Medicine

## 2019-06-08 ENCOUNTER — Encounter (HOSPITAL_BASED_OUTPATIENT_CLINIC_OR_DEPARTMENT_OTHER): Payer: Self-pay | Admitting: *Deleted

## 2019-06-08 ENCOUNTER — Telehealth: Payer: Self-pay | Admitting: Cardiology

## 2019-06-08 DIAGNOSIS — Z87891 Personal history of nicotine dependence: Secondary | ICD-10-CM | POA: Diagnosis not present

## 2019-06-08 DIAGNOSIS — R079 Chest pain, unspecified: Secondary | ICD-10-CM

## 2019-06-08 DIAGNOSIS — R0602 Shortness of breath: Secondary | ICD-10-CM | POA: Diagnosis not present

## 2019-06-08 DIAGNOSIS — Z79899 Other long term (current) drug therapy: Secondary | ICD-10-CM | POA: Insufficient documentation

## 2019-06-08 LAB — CBC WITH DIFFERENTIAL/PLATELET
Abs Immature Granulocytes: 0.01 10*3/uL (ref 0.00–0.07)
Basophils Absolute: 0 10*3/uL (ref 0.0–0.1)
Basophils Relative: 1 %
Eosinophils Absolute: 0.2 10*3/uL (ref 0.0–0.5)
Eosinophils Relative: 4 %
HCT: 39.6 % (ref 36.0–46.0)
Hemoglobin: 12.8 g/dL (ref 12.0–15.0)
Immature Granulocytes: 0 %
Lymphocytes Relative: 32 %
Lymphs Abs: 1.4 10*3/uL (ref 0.7–4.0)
MCH: 30.8 pg (ref 26.0–34.0)
MCHC: 32.3 g/dL (ref 30.0–36.0)
MCV: 95.4 fL (ref 80.0–100.0)
Monocytes Absolute: 0.5 10*3/uL (ref 0.1–1.0)
Monocytes Relative: 11 %
Neutro Abs: 2.3 10*3/uL (ref 1.7–7.7)
Neutrophils Relative %: 52 %
Platelets: 212 10*3/uL (ref 150–400)
RBC: 4.15 MIL/uL (ref 3.87–5.11)
RDW: 12.2 % (ref 11.5–15.5)
WBC: 4.3 10*3/uL (ref 4.0–10.5)
nRBC: 0 % (ref 0.0–0.2)

## 2019-06-08 LAB — COMPREHENSIVE METABOLIC PANEL
ALT: 23 U/L (ref 0–44)
AST: 23 U/L (ref 15–41)
Albumin: 4.4 g/dL (ref 3.5–5.0)
Alkaline Phosphatase: 91 U/L (ref 38–126)
Anion gap: 7 (ref 5–15)
BUN: 13 mg/dL (ref 6–20)
CO2: 28 mmol/L (ref 22–32)
Calcium: 9.3 mg/dL (ref 8.9–10.3)
Chloride: 107 mmol/L (ref 98–111)
Creatinine, Ser: 0.68 mg/dL (ref 0.44–1.00)
GFR calc Af Amer: >60 mL/min (ref >60)
GFR calc non Af Amer: >60 mL/min (ref >60)
Glucose, Bld: 132 mg/dL — ABNORMAL HIGH (ref 70–99)
Potassium: 3.6 mmol/L (ref 3.5–5.1)
Sodium: 142 mmol/L (ref 135–145)
Total Bilirubin: 1.5 mg/dL — ABNORMAL HIGH (ref 0.3–1.2)
Total Protein: 7.2 g/dL (ref 6.5–8.1)

## 2019-06-08 LAB — TROPONIN I: Troponin I: <0.03 ng/mL (ref <0.03)

## 2019-06-08 NOTE — ED Notes (Signed)
ED Provider at bedside. 

## 2019-06-08 NOTE — ED Triage Notes (Signed)
SOB on and off all the time per pt. She has had chest pain that feels like heart burn since starting pravastatin a week ago. Her MD told her to stop the medication. She has not had the medication in 2 days.

## 2019-06-08 NOTE — Telephone Encounter (Signed)
I do not think her chest pain is related to her pravastatin which was stopped. Given unrelenting chest pain, she should proceed to the emergency department at [place] for cardiac workup.

## 2019-06-08 NOTE — Telephone Encounter (Signed)
Patient advised that Dr Bettina Gavia does not feel any of the symptoms she is having is related to pravastatin.  Patient was advised to report to ED for cardiac workup immediately.  Patient agreed to plan and will present to [place] ED.

## 2019-06-08 NOTE — ED Provider Notes (Signed)
[facility]ERGENC[facility] DEPARTMENT Provider Note   CSN: [id number] Arrival date & time: 06/08/19  1423     History   Chief Complaint Chief Complaint  Patient presents with  . Shortness of Breath  . Chest Pain    HPI Michele Jacobs is a 58 y.o. female.  HPI   72yF with CP and dyspnea. Described burning sensation in chest. Associated with initiating a statin. Called PCP who advised to stop which she did yesterday. Symptoms improved but still present so came to ED.   Past Medical History:  Diagnosis Date  . Anxiety   . Anxiety state 04/02/2013   Overview:  STORY: See history dated 01/19/2010`E1o3L`IMPRESSION: Continue medications as prescribed.  Relaxation techniques described.  Using Xanax prn.  May call for refills.  Using xanax 1/2 tablet qHS prn.  Last Assessment & Plan:  Worsening since her father became terminally ill. History of the same with similar presentation. She does not want to go back on Paxil which may have helped in the pa  . 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  Repeat 2014`E1o3L`IMPRESSION: Continue calcium 1200mg  daily with vitamin D.  . Ear pain, bilateral 08/22/2014   Last Assessment & Plan:  May be related to eustachian tube dysfunction. Advised antihistamine-decongestant and Flonase Daily for the next 10-14 days. If symptoms do not improve, or if they worsen, notify me.  . Increased frequency of urination 08/12/2012  . 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 Mitral valve prolapse   . Muscle cramps 08/22/2014   Last Assessment & Plan:  Relative dehydration possibly. Labs ordered. Advised trial of magnesium supplement, 400-800 milligrams nightly over the next few weeks.  . Osteopenia 04/24/2015   Per dexa 03/2015, 10/18  . Palpitations 01/20/2014  . 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 Tobacco use disorder 04/02/2013   Overview:  IMPRESSION: Actively trying to quit.  The goal for  anyone who smokes is to decrease the amount smoked each day and then eventually quit.  Stop smoking groups, printed information and medications are all available for your use when you decide to stop. She ONLY smokes if she has had quite a few drinks. jmw    Patient Active Problem List   Diagnosis Date Noted  . 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 Osteopenia 04/24/2015  . Ear pain, bilateral 08/22/2014  . Muscle cramps 08/22/2014  . Mitral valve prolapse 01/20/2014  . Palpitations 01/20/2014  . Anxiety state 04/02/2013  . 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 Tobacco use disorder 04/02/2013  . Increased frequency of urination 08/12/2012    Past Surgical History:  Procedure Laterality Date  . THYROGLOSSAL DUCT CYST  01/2018   removal  . TUBAL LIGATION       OB History    Gravida  0   Para  0   Term  0   Preterm  0   AB  0   Living  0     SAB  0   TAB  0   Ectopic  0   Multiple  0   Live Births  0            Home Medications    Prior to Admission medications   Medication Sig Start Date End Date Taking? Authorizing Provider  albuterol (VENTOLIN HFA) 108 (90 Base) MCG/ACT inhaler Inhale 2  puffs into the lungs every 6 (six) hours as needed for wheezing or shortness of breath. 05/10/19  Yes [name], [name], MD  ALPRAZolam (XANAX) 0.25 MG tablet TAKE ONE TABLET BY MOUTH DAILY AS NEEDED FOR ANXIETY 05/10/19  Yes [name], [name], MD  aspirin EC 81 MG tablet Take 1 tablet (81 mg total) by mouth daily. 06/01/19  Yes Baldo DaubMunley, Brian J, MD  cholecalciferol (VITAMIN D) 1000 units tablet Take 1,000 Units by mouth daily.   Yes [provider]  erythromycin with ethanol (EMGEL) 2 % gel Apply topically daily. 11/17/17  Yes [name], [name], MD  pravastatin (PRAVACHOL) 20 MG tablet Take 20 mg by mouth daily.   Yes [provider]  TOPROL XL 25 MG 24 hr tablet Take  1 tablet (25 mg total) by mouth daily. 05/10/19  Yes [name], [name], MD    Family History Family History  Problem Relation Age of Onset  . Ovarian cancer Mother   . Cancer Mother   . Lymphoma Father   . Heart disease Father   . Cancer Father   . Non-Hodgkin's lymphoma Father   . Atrial fibrillation Father   . Obesity Sister     Social History Social History   Tobacco Use  . Smoking status: Former Smoker    Packs/day: 0.10    Years: 30.00    Pack years: 3.00    Types: Cigarettes  . Smokeless tobacco: Never Used  . Tobacco comment: quit in 2009  Substance Use Topics  . Alcohol use: No  . Drug use: No     Allergies   Codeine   Review of Systems Review of Systems  All systems reviewed and negative, other than as noted in HPI.  Physical Exam Updated Vital Signs BP 114/71   Pulse 74   Temp 98.1 F (36.7 C) (Oral)   Resp 14   Ht 5\' 5"  (1.651 m)   Wt 55.4 kg   SpO2 100%   BMI 20.32 kg/m   Physical Exam Vitals signs and nursing note reviewed.  Constitutional:      General: She is not in acute distress.    Appearance: She is well-developed.  HENT:     Head: Normocephalic and atraumatic.  Eyes:     General:        Right eye: No discharge.        Left eye: No discharge.     Conjunctiva/sclera: Conjunctivae normal.  Neck:     Musculoskeletal: Neck supple.  Cardiovascular:     Rate and Rhythm: Normal rate and regular rhythm.     Heart sounds: Normal heart sounds. No murmur. No friction rub. No gallop.   Pulmonary:     Effort: Pulmonary effort is normal. No respiratory distress.     Breath sounds: Normal breath sounds.  Abdominal:     General: There is no distension.     Palpations: Abdomen is soft.     Tenderness: There is no abdominal tenderness.  Musculoskeletal:        General: No tenderness.     Comments: Lower extremities symmetric as compared to each other. No calf tenderness. Negative Homan's. No palpable cords.   Skin:    General: Skin  is warm and dry.  Neurological:     Mental Status: She is alert.  Psychiatric:        Behavior: Behavior normal.        Thought Content: Thought content normal.      ED Treatments /  Results  Labs (all labs ordered are listed, but only abnormal results are displayed) Labs Reviewed  COMPREHENSIVE METABOLIC PANEL - Abnormal; Notable for the following components:      Result Value   Glucose, Bld 132 (*)    Total Bilirubin 1.5 (*)    All other components within normal limits  CBC WITH DIFFERENTIAL/PLATELET  TROPONIN I   EKG:  Rhythm: normal sinus Rate: 79 PR: 138 ms QRS: 91 ms QTc: 450 ms ST segments: normal  Radiology No results found.  Procedures Procedures (including critical care time)  Medications Ordered in ED Medications - No data to display   Initial Impression / Assessment and Plan / ED Course  I have reviewed the triage vital signs and the nursing notes.  Pertinent labs & imaging results that were available during my care of the patient were reviewed by me and considered in my medical decision making (see chart for details).        57yF with CP. Seems atypical for ACs. Doubt PE, dissection or other emergent process. Suspect symptoms may be related to statin. Improving since stopped yesterday. Hopefully will continue to improve. She also says she started PPI which she had previously been prescribed that helped. Advised to continue.   Final Clinical Impressions(s) / ED Diagnoses   Final diagnoses:  Chest pain, unspecified type    ED Discharge Orders    None       Raeford RazorKohut, Xinyi Batton, MD 06/09/19 [phone]

## 2019-06-08 NOTE — Telephone Encounter (Signed)
Patient called yesterday to discuss going off her medicine.  She did not take it yesterday or today but is still having chest tightness.  She says it is very uncomfortable and is really worrying her. I made her an OV appt for Thursday but told her I would send this note for BJM and team to be aware in case anything needs to be done sooner than Thursday.

## 2019-06-08 NOTE — Telephone Encounter (Signed)
Patient states that she is still having complaints of "chest tightness, pressure and pain."  She states "when I push on my chest, I get relief."  Patient reports eating a pack of crackers early this morning and potato skins for lunch.   She is currently lying in bed, and the pain is "so bad I am about in tears."  She states this is a constant pain that has been going on for several days.  She has not taken pravastatin today or yesterday as directed yesterday.  Pateint does not report any SHOB, edema, or palpitations.

## 2019-06-10 ENCOUNTER — Ambulatory Visit: Payer: BC Managed Care – PPO | Admitting: Cardiology

## 2019-06-15 ENCOUNTER — Encounter: Payer: Self-pay | Admitting: Family Medicine

## 2019-06-16 NOTE — Telephone Encounter (Signed)
Called [facility] - fax number is [phone]. LEFT hip Order sent

## 2019-06-17 ENCOUNTER — Encounter: Payer: Self-pay | Admitting: Family Medicine

## 2019-06-17 DIAGNOSIS — M25552 Pain in left hip: Secondary | ICD-10-CM | POA: Diagnosis not present

## 2019-06-22 ENCOUNTER — Encounter: Payer: Self-pay | Admitting: Family Medicine

## 2019-08-02 ENCOUNTER — Telehealth: Payer: Self-pay | Admitting: Family Medicine

## 2019-08-02 NOTE — Telephone Encounter (Signed)
Pt stated she had a scan 05.29.20 and the ins stated it was coded wrong, it was coded has well care but it should've been coded has preventative care. Please call pt to advise.

## 2019-08-03 ENCOUNTER — Encounter: Payer: Self-pay | Admitting: Family Medicine

## 2019-08-03 NOTE — Telephone Encounter (Signed)
Pt had the low dose lung cancer screening CT.  I am not sure how to fix this.  Will send to billing-thank you!

## 2019-08-09 NOTE — Telephone Encounter (Signed)
I do not have access to see how it was billed since test was performed at [facility]. However, I can see what dx was on order.  The dx on order for the low dose CT was Z12.2 (screening of malignant neoplasm of lung) & also former smoker.  [employer] does not cover low dose lung cancer CT with E17.4 per policy.  However, the second reason for test on order (former smoker-Z87.891) would be ok per [employer] low dose lung cancer screening policy.  Someone from your office would need to contact facility where test was performed and tell them order should be changed from Z12.2 to the Z87.891 former smoker and a corrected claim would need to be filed with Z87.891 as dx.  Thanks, Sharyn Lull

## 2019-08-09 NOTE — Telephone Encounter (Signed)
Are you able to help with this one?

## 2019-08-25 ENCOUNTER — Other Ambulatory Visit: Payer: Self-pay

## 2019-08-25 DIAGNOSIS — R6889 Other general symptoms and signs: Secondary | ICD-10-CM | POA: Diagnosis not present

## 2019-08-25 DIAGNOSIS — Z20822 Contact with and (suspected) exposure to covid-19: Secondary | ICD-10-CM

## 2019-08-26 ENCOUNTER — Telehealth: Payer: Self-pay | Admitting: General Practice

## 2019-08-26 LAB — NOVEL CORONAVIRUS, NAA: SARS-CoV-2, NAA: NOT DETECTED

## 2019-08-26 NOTE — Telephone Encounter (Signed)
Negative COVID results given. Patient results "NOT Detected." Caller expressed understanding. ° °

## 2019-09-02 ENCOUNTER — Encounter: Payer: Self-pay | Admitting: Family Medicine

## 2019-09-30 DIAGNOSIS — R319 Hematuria, unspecified: Secondary | ICD-10-CM | POA: Diagnosis not present

## 2019-09-30 DIAGNOSIS — Z681 Body mass index (BMI) 19 or less, adult: Secondary | ICD-10-CM | POA: Diagnosis not present

## 2019-09-30 DIAGNOSIS — Z01419 Encounter for gynecological examination (general) (routine) without abnormal findings: Secondary | ICD-10-CM | POA: Diagnosis not present

## 2019-09-30 DIAGNOSIS — R8290 Unspecified abnormal findings in urine: Secondary | ICD-10-CM | POA: Diagnosis not present

## 2019-09-30 DIAGNOSIS — Z1231 Encounter for screening mammogram for malignant neoplasm of breast: Secondary | ICD-10-CM | POA: Diagnosis not present

## 2019-10-01 DIAGNOSIS — H1089 Other conjunctivitis: Secondary | ICD-10-CM | POA: Diagnosis not present

## 2019-10-06 DIAGNOSIS — Z13820 Encounter for screening for osteoporosis: Secondary | ICD-10-CM | POA: Diagnosis not present

## 2019-10-13 ENCOUNTER — Ambulatory Visit: Payer: BLUE CROSS/BLUE SHIELD

## 2019-10-13 ENCOUNTER — Other Ambulatory Visit: Payer: BLUE CROSS/BLUE SHIELD

## 2019-11-07 NOTE — Progress Notes (Signed)
[facility] [address], [address] [place], [place] [address] [phone]  Date:  11/10/2019   Name:  Michele Jacobs   DOB:  Oct 05, 1961   MRN:  [id number]  PCP:  Pearline Cables, MD    Chief Complaint: 6 month follow up   History of Present Illness:  Michele Jacobs is a 58 y.o. very pleasant female patient who presents with the following:  Here today for routine 1-month follow-up visit-I saw her in May for physical Patient with history of reflux, anxiety, mitral valve prolapse and palpitations She quit smoking in about 2004 She is taking metoprolol XL 25 daily for her palpitations Uses Xanax on occasion  Screening CT for lung cancer negative May of this year Mammogram done in October Bone density done at [facility] 09/2019 Cologuard up-to-date Suggest Shingrix- offered to do today- she would like to delay due to covid pandemic  Labs are up-to-date  She was seen in the ER in June with some chest pain-found to be noncardiac, we think related to taking a statin We have tried to start her on a statin, but it seems this may cause her chest discomfort She is not taking statin currently due to the side effect  She walks on her treadmilll daily and works out on her bowflex No CP or abnormal SOB with exercise She may walk on her treadmill briskly 10- 20 minutes up to 3x a day   Her father is in poor health, he lives with her.   She got a puppy recently for her dad (he had really wanted a new dog)- now about 19 months old and doing well  She has lost some weight- she is more active taking care of the puppy- "Fredric Mare" needs to be walked and taken out several times each night, she is not getting a lot of sleep  Wt Readings from Last 3 Encounters:  11/10/19 112 lb (50.8 kg)  06/08/19 122 lb 1.6 oz (55.4 kg)  06/01/19 120 lb 6.4 oz (54.6 kg)    Patient Active Problem List   Diagnosis Date Noted  . 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 Osteopenia 04/24/2015  . Ear pain, bilateral 08/22/2014  . Muscle cramps 08/22/2014  . Mitral valve prolapse 01/20/2014  . Palpitations 01/20/2014  . Anxiety state 04/02/2013  . 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 Tobacco use disorder 04/02/2013  . Increased frequency of urination 08/12/2012    Past Medical History:  Diagnosis Date  . Anxiety   . Anxiety state 04/02/2013   Overview:  STORY: See history dated 01/19/2010`E1o3L`IMPRESSION: Continue medications as prescribed.  Relaxation techniques described.  Using Xanax prn.  May call for refills.  Using xanax 1/2 tablet qHS prn.  Last Assessment & Plan:  Worsening since her father became terminally ill. History of the same with similar presentation. She does not want to go back on Paxil which may have helped in the pa  . 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  Repeat 2014`E1o3L`IMPRESSION: Continue calcium 1200mg  daily with vitamin D.  . Ear pain, bilateral 08/22/2014   Last Assessment & Plan:  May be related to eustachian tube dysfunction. Advised antihistamine-decongestant and Flonase Daily for the next 10-14 days. If symptoms do not improve, or if they worsen, notify me.  08/24/2014  Increased frequency of urination 08/12/2012  . 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 Mitral valve prolapse   . Muscle cramps 08/22/2014   Last Assessment & Plan:  Relative dehydration possibly. Labs ordered. Advised trial of magnesium supplement, 400-800 milligrams nightly over the next few weeks.  . Osteopenia 04/24/2015   Per dexa 03/2015, 10/18  . Palpitations 01/20/2014  . 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 Tobacco use disorder 04/02/2013   Overview:  IMPRESSION: Actively trying to quit.  The goal for anyone who smokes is to decrease the amount smoked each day and then eventually quit.  Stop  smoking groups, printed information and medications are all available for your use when you decide to stop. She ONLY smokes if she has had quite a few drinks. jmw    Past Surgical History:  Procedure Laterality Date  . THYROGLOSSAL DUCT CYST  01/2018   removal  . TUBAL LIGATION      Social History   Tobacco Use  . Smoking status: Former Smoker    Packs/day: 0.10    Years: 30.00    Pack years: 3.00    Types: Cigarettes  . Smokeless tobacco: Never Used  . Tobacco comment: quit in 2009  Substance Use Topics  . Alcohol use: No  . Drug use: No    Family History  Problem Relation Age of Onset  . Ovarian cancer Mother   . Cancer Mother   . Lymphoma Father   . Heart disease Father   . Cancer Father   . Non-Hodgkin's lymphoma Father   . Atrial fibrillation Father   . Obesity Sister     Allergies  Allergen Reactions  . Codeine Nausea And Vomiting    Medication list has been reviewed and updated.  Current Outpatient Medications on File Prior to Visit  Medication Sig Dispense Refill  . albuterol (VENTOLIN HFA) 108 (90 Base) MCG/ACT inhaler Inhale 2 puffs into the lungs every 6 (six) hours as needed for wheezing or shortness of breath. 1 Inhaler 2  . ALPRAZolam (XANAX) 0.25 MG tablet TAKE ONE TABLET BY MOUTH DAILY AS NEEDED FOR ANXIETY 30 tablet 1  . aspirin EC 81 MG tablet Take 1 tablet (81 mg total) by mouth daily. 90 tablet 3  . cholecalciferol (VITAMIN D) 1000 units tablet Take 1,000 Units by mouth daily.    Marland Kitchen erythromycin with ethanol (EMGEL) 2 % gel Apply topically daily. 30 g 1  . TOPROL XL 25 MG 24 hr tablet Take 1 tablet (25 mg total) by mouth daily. 90 tablet 3   No current facility-administered medications on file prior to visit.     Review of Systems:  As per HPI- otherwise negative. No fever or chills  Physical Examination: Vitals:   11/10/19 0851  BP: 102/60  Pulse: 62  Resp: 16  Temp: (!) 96.5 F (35.8 C)  SpO2: 98%   Vitals:   11/10/19 0851   Weight: 112 lb (50.8 kg)  Height: 5\' 5"  (1.651 m)   Body mass index is 18.64 kg/m. Ideal Body Weight: Weight in (lb) to have BMI = 25: 149.9  GEN: WDWN, NAD, Non-toxic, A & O x 3, slim/normal weight, looks well  HEENT: Atraumatic, Normocephalic. Neck supple. No masses, No LAD. Ears and Nose: No external deformity. CV: RRR, No M/G/R. No JVD. No thrill. No extra heart sounds. PULM: CTA B, no wheezes, crackles, rhonchi. No retractions. No resp. distress. No accessory muscle use. ABD: S, NT, ND, +BS. No rebound. No HSM. EXTR: No  c/c/e NEURO Normal gait.  PSYCH: Normally interactive. Conversant. Not depressed or anxious appearing.  Calm demeanor.    Assessment and Plan: Weight loss  GAD (generalized anxiety disorder)  Heart palpitations  Following up for routine checkup today She has lost a few pounds recently due to increased exercise.  I encouraged her to try to gain this weight back and she will work on it.  She will continue to monitor her weight at home and let me know if she continues to lose Anxiety is under good control Palpitations okay with current metoprolol She is really not able to tolerate a statin Physical exam set for May 2021  Signed Abbe AmsterdamJessica Darinda [place], MD

## 2019-11-07 NOTE — Patient Instructions (Addendum)
It was great to see you again today, plan to visit again in 6 months for a physical You have lost some weight- work on putting these pounds back on    We can do your shingles vaccine next year for you  Take care!

## 2019-11-09 ENCOUNTER — Other Ambulatory Visit: Payer: Self-pay

## 2019-11-10 ENCOUNTER — Ambulatory Visit (INDEPENDENT_AMBULATORY_CARE_PROVIDER_SITE_OTHER): Payer: BC Managed Care – PPO | Admitting: Family Medicine

## 2019-11-10 ENCOUNTER — Encounter: Payer: Self-pay | Admitting: Family Medicine

## 2019-11-10 VITALS — BP 102/60 | HR 62 | Temp 96.5°F | Resp 16 | Ht 65.0 in | Wt 112.0 lb

## 2019-11-10 DIAGNOSIS — F411 Generalized anxiety disorder: Secondary | ICD-10-CM | POA: Diagnosis not present

## 2019-11-10 DIAGNOSIS — R002 Palpitations: Secondary | ICD-10-CM

## 2019-11-10 DIAGNOSIS — R634 Abnormal weight loss: Secondary | ICD-10-CM

## 2020-05-06 NOTE — Progress Notes (Addendum)
[facility] [address] [place], [place] [address] [phone]  Date:  05/10/2020   Name:  Michele Jacobs   DOB:  [birth year]   MRN:  [id number]  PCP:  Pearline Cables, MD    Chief Complaint: Annual Exam (vaginal discharge with urination) and Eye Twitching (right eye)   History of Present Illness:  Michele Jacobs is a 59 y.o. very pleasant female patient who presents with the following:  Here today for a CPE history of osteopenia, MVP, reflux, anxiety, palpitations treated with metoprolol Last seen by myself in November of 2020- at that time she has lost a few lbs, we thought perhaps due to taking care of her new puppy.  The puppy is still keeping her really busy and is not that well behaved   Does not tolerate statins well  covid series; done  Colon UTD Pap UTD mammo UTD shingirx Labs done in June 2020- she is fasting today  She sees OBGYn for her pap and dexa  Former smoker, quit in 2009- due for lung cancer screening CT this month   She has noted her right eye twitching on occasion since she got her 2nd covid vaccine about 10 weeks ago.  No pain, no vision change. No other muscle twitches that she has noticed  She has noted some white discharge/mucus from her anus- this has been present for about 2 weeks.  After she has a bowel movement she may notice mucus when she wipes.  No bleeding, she is constipated but this is not new to her  She had cologuard but did not see GI for her recent colon cancer screening She does not have any pain or other digestive changes  Wt Readings from Last 3 Encounters:  05/10/20 115 lb (52.2 kg)  11/10/19 112 lb (50.8 kg)  06/08/19 122 lb 1.6 oz (55.4 kg)   BP Readings from Last 3 Encounters:  05/10/20 117/75  11/10/19 102/60  06/08/19 (!) 94/52   Pulse Readings from Last 3 Encounters:  05/10/20 (!) 50  11/10/19 62  06/08/19 69     Patient Active Problem List    Diagnosis Date Noted  . 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 Osteopenia 04/24/2015  . Ear pain, bilateral 08/22/2014  . Muscle cramps 08/22/2014  . Mitral valve prolapse 01/20/2014  . Palpitations 01/20/2014  . Anxiety state 04/02/2013  . 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 Tobacco use disorder 04/02/2013  . Increased frequency of urination 08/12/2012    Past Medical History:  Diagnosis Date  . Anxiety   . Anxiety state 04/02/2013   Overview:  STORY: See history dated 01/19/2010`E1o3L`IMPRESSION: Continue medications as prescribed.  Relaxation techniques described.  Using Xanax prn.  May call for refills.  Using xanax 1/2 tablet qHS prn.  Last Assessment & Plan:  Worsening since her father became terminally ill. History of the same with similar presentation. She does not want to go back on Paxil which may have helped in the pa  . 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  Repeat 2014`E1o3L`IMPRESSION: Continue calcium 1200mg  daily with vitamin D.  . Ear pain, bilateral 08/22/2014   Last Assessment & Plan:  May be related to eustachian tube dysfunction. Advised antihistamine-decongestant and Flonase Daily for the next 10-14 days.  If symptoms do not improve, or if they worsen, notify me.  . Increased frequency of urination 08/12/2012  . 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 Mitral valve prolapse   . Muscle cramps 08/22/2014   Last Assessment & Plan:  Relative dehydration possibly. Labs ordered. Advised trial of magnesium supplement, 400-800 milligrams nightly over the next few weeks.  . Osteopenia 04/24/2015   Per dexa 03/2015, 10/18  . Palpitations 01/20/2014  . 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 Tobacco use disorder 04/02/2013   Overview:  IMPRESSION: Actively trying to quit.  The goal for anyone who smokes is to decrease the  amount smoked each day and then eventually quit.  Stop smoking groups, printed information and medications are all available for your use when you decide to stop. She ONLY smokes if she has had quite a few drinks. jmw    Past Surgical History:  Procedure Laterality Date  . THYROGLOSSAL DUCT CYST  01/2018   removal  . TUBAL LIGATION      Social History   Tobacco Use  . Smoking status: Former Smoker    Packs/day: 0.10    Years: 30.00    Pack years: 3.00    Types: Cigarettes  . Smokeless tobacco: Never Used  . Tobacco comment: quit in 2009  Substance Use Topics  . Alcohol use: No  . Drug use: No    Family History  Problem Relation Age of Onset  . Ovarian cancer Mother   . Cancer Mother   . Lymphoma Father   . Heart disease Father   . Cancer Father   . Non-Hodgkin's lymphoma Father   . Atrial fibrillation Father   . Obesity Sister     Allergies  Allergen Reactions  . Codeine Nausea And Vomiting    Medication list has been reviewed and updated.  Current Outpatient Medications on File Prior to Visit  Medication Sig Dispense Refill  . albuterol (VENTOLIN HFA) 108 (90 Base) MCG/ACT inhaler Inhale 2 puffs into the lungs every 6 (six) hours as needed for wheezing or shortness of breath. 1 Inhaler 2  . ALPRAZolam (XANAX) 0.25 MG tablet TAKE ONE TABLET BY MOUTH DAILY AS NEEDED FOR ANXIETY 30 tablet 1  . aspirin EC 81 MG tablet Take 1 tablet (81 mg total) by mouth daily. 90 tablet 3  . cholecalciferol (VITAMIN D) 1000 units tablet Take 1,000 Units by mouth daily.    Marland Kitchen erythromycin with ethanol (EMGEL) 2 % gel Apply topically daily. 30 g 1  . TOPROL XL 25 MG 24 hr tablet Take 1 tablet (25 mg total) by mouth daily. 90 tablet 3   No current facility-administered medications on file prior to visit.    Review of Systems:  As per HPI- otherwise negative.   Physical Examination: Vitals:   05/10/20 0816  BP: 117/75  Pulse: (!) 50  Resp: 16  SpO2: 100%   Vitals:    05/10/20 0816  Weight: 115 lb (52.2 kg)  Height: 5\' 5"  (1.651 m)   Body mass index is 19.14 kg/m. Ideal Body Weight: Weight in (lb) to have BMI = 25: 149.9  GEN: no acute distress.  Slim build, looks well HEENT: Atraumatic, Normocephalic.  Bilateral TM wnl, oropharynx normal.  PEERL,EOMI.   Ears and Nose: No external deformity. CV: RRR, No M/G/R. No JVD. No thrill. No extra heart sounds. PULM: CTA B, no wheezes, crackles, rhonchi. No retractions. No resp. distress. No accessory muscle use. ABD: S, NT, ND, +BS. No rebound. No HSM.  EXTR: No c/c/e PSYCH: Normally interactive. Conversant.  Digital rectal exam is normal, no blood or mucus   Assessment and Plan: Physical exam  Screening for hyperlipidemia - Plan: Lipid panel  Screening for deficiency anemia - Plan: CBC  Screening for diabetes mellitus - Plan: Comprehensive metabolic panel, Hemoglobin A1c  Screening for thyroid disorder - Plan: TSH  Encounter for screening for lung cancer - Plan: CT CHEST LUNG CA SCREEN LOW DOSE W/O CM  Heart palpitations - Plan: TOPROL XL 25 MG 24 hr tablet  Eczema of eyelid, unspecified laterality - Plan: triamcinolone ointment (KENALOG) 0.1 %  Patient here today for a physical exam Routine labs pending as above Ordered lung cancer screening CT We discussed mucus she has noted per rectum recently.  Offered to have her see GI.  She would like to wait and see if this will resolve, she otherwise feels well and has no pain.  I think this is reasonable, will also check labs as above She had an old prescription for triamcinolone ointment which she has used for eyelid eczema in the past.  Requests a refill of this today This visit occurred during the SARS-CoV-2 public health emergency.  Safety protocols were in place, including screening questions prior to the visit, additional usage of staff PPE, and extensive cleaning of exam room while observing appropriate contact time as indicated for disinfecting  solutions.   Pt prefers a female GI if needed   Discussed diet, exercise, tobacco and alcohol avoidance   Signed Lamar Blinks, MD  Received her labs as below, message to pt  Results for orders placed or performed in visit on 05/10/20  CBC  Result Value Ref Range   WBC 4.1 4.0 - 10.5 K/uL   RBC 4.13 3.87 - 5.11 Mil/uL   Platelets 206.0 150.0 - 400.0 K/uL   Hemoglobin 13.0 12.0 - 15.0 g/dL   HCT 38.4 36.0 - 46.0 %   MCV 92.9 78.0 - 100.0 fl   MCHC 33.8 30.0 - 36.0 g/dL   RDW 13.4 11.5 - 15.5 %  Comprehensive metabolic panel  Result Value Ref Range   Sodium 139 135 - 145 mEq/L   Potassium 4.5 3.5 - 5.1 mEq/L   Chloride 104 96 - 112 mEq/L   CO2 29 19 - 32 mEq/L   Glucose, Bld 75 70 - 99 mg/dL   BUN 16 6 - 23 mg/dL   Creatinine, Ser 0.69 0.40 - 1.20 mg/dL   Total Bilirubin 1.7 (H) 0.2 - 1.2 mg/dL   Alkaline Phosphatase 87 39 - 117 U/L   AST 17 0 - 37 U/L   ALT 14 0 - 35 U/L   Total Protein 6.4 6.0 - 8.3 g/dL   Albumin 4.5 3.5 - 5.2 g/dL   GFR 87.46 >60.00 mL/min   Calcium 9.3 8.4 - 10.5 mg/dL  Hemoglobin A1c  Result Value Ref Range   Hgb A1c MFr Bld 5.4 4.6 - 6.5 %  Lipid panel  Result Value Ref Range   Cholesterol 178 0 - 200 mg/dL   Triglycerides 52.0 0.0 - 149.0 mg/dL   HDL 80.50 >39.00 mg/dL   VLDL 10.4 0.0 - 40.0 mg/dL   LDL Cholesterol 87 0 - 99 mg/dL   Total CHOL/HDL Ratio 2    NonHDL 97.48   TSH  Result Value Ref Range   TSH 1.41 0.35 - 4.50 uIU/mL

## 2020-05-10 ENCOUNTER — Encounter: Payer: Self-pay | Admitting: Family Medicine

## 2020-05-10 ENCOUNTER — Other Ambulatory Visit: Payer: Self-pay

## 2020-05-10 ENCOUNTER — Ambulatory Visit (INDEPENDENT_AMBULATORY_CARE_PROVIDER_SITE_OTHER): Payer: BC Managed Care – PPO | Admitting: Family Medicine

## 2020-05-10 VITALS — BP 117/75 | HR 50 | Resp 16 | Ht 65.0 in | Wt 115.0 lb

## 2020-05-10 DIAGNOSIS — Z1329 Encounter for screening for other suspected endocrine disorder: Secondary | ICD-10-CM | POA: Diagnosis not present

## 2020-05-10 DIAGNOSIS — Z13220 Encounter for screening for lipoid disorders: Secondary | ICD-10-CM | POA: Diagnosis not present

## 2020-05-10 DIAGNOSIS — Z0000 Encounter for general adult medical examination without abnormal findings: Secondary | ICD-10-CM | POA: Diagnosis not present

## 2020-05-10 DIAGNOSIS — Z131 Encounter for screening for diabetes mellitus: Secondary | ICD-10-CM

## 2020-05-10 DIAGNOSIS — H01139 Eczematous dermatitis of unspecified eye, unspecified eyelid: Secondary | ICD-10-CM

## 2020-05-10 DIAGNOSIS — Z130 Encounter for screening for diseases of the blood and blood-forming organs and certain disorders involving the immune mechanism: Secondary | ICD-10-CM | POA: Diagnosis not present

## 2020-05-10 DIAGNOSIS — Z122 Encounter for screening for malignant neoplasm of respiratory organs: Secondary | ICD-10-CM

## 2020-05-10 DIAGNOSIS — R002 Palpitations: Secondary | ICD-10-CM

## 2020-05-10 LAB — CBC
HCT: 38.4 % (ref 36.0–46.0)
Hemoglobin: 13 g/dL (ref 12.0–15.0)
MCHC: 33.8 g/dL (ref 30.0–36.0)
MCV: 92.9 fl (ref 78.0–100.0)
Platelets: 206 10*3/uL (ref 150.0–400.0)
RBC: 4.13 Mil/uL (ref 3.87–5.11)
RDW: 13.4 % (ref 11.5–15.5)
WBC: 4.1 10*3/uL (ref 4.0–10.5)

## 2020-05-10 LAB — LIPID PANEL
Cholesterol: 178 mg/dL (ref 0–200)
HDL: 80.5 mg/dL (ref >39.00)
LDL Cholesterol: 87 mg/dL (ref 0–99)
NonHDL: 97.48
Total CHOL/HDL Ratio: 2
Triglycerides: 52 mg/dL (ref 0.0–149.0)
VLDL: 10.4 mg/dL (ref 0.0–40.0)

## 2020-05-10 LAB — HEMOGLOBIN A1C: Hgb A1c MFr Bld: 5.4 % (ref 4.6–6.5)

## 2020-05-10 LAB — COMPREHENSIVE METABOLIC PANEL
ALT: 14 U/L (ref 0–35)
AST: 17 U/L (ref 0–37)
Albumin: 4.5 g/dL (ref 3.5–5.2)
Alkaline Phosphatase: 87 U/L (ref 39–117)
BUN: 16 mg/dL (ref 6–23)
CO2: 29 mEq/L (ref 19–32)
Calcium: 9.3 mg/dL (ref 8.4–10.5)
Chloride: 104 mEq/L (ref 96–112)
Creatinine, Ser: 0.69 mg/dL (ref 0.40–1.20)
GFR: 87.46 mL/min (ref >60.00)
Glucose, Bld: 75 mg/dL (ref 70–99)
Potassium: 4.5 mEq/L (ref 3.5–5.1)
Sodium: 139 mEq/L (ref 135–145)
Total Bilirubin: 1.7 mg/dL — ABNORMAL HIGH (ref 0.2–1.2)
Total Protein: 6.4 g/dL (ref 6.0–8.3)

## 2020-05-10 LAB — TSH: TSH: 1.41 u[IU]/mL (ref 0.35–4.50)

## 2020-05-10 MED ORDER — TOPROL XL 25 MG PO TB24: 25.0000 mg | ORAL_TABLET | Freq: Every day | ORAL | 3 refills | Status: DC

## 2020-05-10 MED ORDER — TRIAMCINOLONE ACETONIDE 0.1 % EX OINT: 1.0000 "application " | TOPICAL_OINTMENT | Freq: Two times a day (BID) | CUTANEOUS | 0 refills | Status: DC

## 2020-05-10 NOTE — Patient Instructions (Signed)
It was great to see you again as always I will be in touch with your labs, and we will set you up for a lung cancer screening CT at [facility] on [facility]  Consider getting your shingles vaccine at your convenience  If your mucus problem does not resolve let me know and I will refer you to see GI    Health Maintenance, Female Adopting a healthy lifestyle and getting preventive care are important in promoting health and wellness. Ask your health care provider about:  The right schedule for you to have regular tests and exams.  Things you can do on your own to prevent diseases and keep yourself healthy. What should I know about diet, weight, and exercise? Eat a healthy diet   Eat a diet that includes plenty of vegetables, fruits, low-fat dairy products, and lean protein.  Do not eat a lot of foods that are high in solid fats, added sugars, or sodium. Maintain a healthy weight Body mass index (BMI) is used to identify weight problems. It estimates body fat based on height and weight. Your health care provider can help determine your BMI and help you achieve or maintain a healthy weight. Get regular exercise Get regular exercise. This is one of the most important things you can do for your health. Most adults should:  Exercise for at least 150 minutes each week. The exercise should increase your heart rate and make you sweat (moderate-intensity exercise).  Do strengthening exercises at least twice a week. This is in addition to the moderate-intensity exercise.  Spend less time sitting. Even light physical activity can be beneficial. Watch cholesterol and blood lipids Have your blood tested for lipids and cholesterol at 58 years of age, then have this test every 5 years. Have your cholesterol levels checked more often if:  Your lipid or cholesterol levels are high.  You are older than 58 years of age.  You are at high risk for heart disease. What should I know about cancer  screening? Depending on your health history and family history, you may need to have cancer screening at various ages. This may include screening for:  Breast cancer.  Cervical cancer.  Colorectal cancer.  Skin cancer.  Lung cancer. What should I know about heart disease, diabetes, and high blood pressure? Blood pressure and heart disease  High blood pressure causes heart disease and increases the risk of stroke. This is more likely to develop in people who have high blood pressure readings, are of African descent, or are overweight.  Have your blood pressure checked: ? Every 3-5 years if you are 96-22 years of age. ? Every year if you are 100 years old or older. Diabetes Have regular diabetes screenings. This checks your fasting blood sugar level. Have the screening done:  Once every three years after age 95 if you are at a normal weight and have a low risk for diabetes.  More often and at a younger age if you are overweight or have a high risk for diabetes. What should I know about preventing infection? Hepatitis B If you have a higher risk for hepatitis B, you should be screened for this virus. Talk with your health care provider to find out if you are at risk for hepatitis B infection. Hepatitis C Testing is recommended for:  Everyone born from [birth year] through 1964.  Anyone with known risk factors for hepatitis C. Sexually transmitted infections (STIs)  Get screened for STIs, including gonorrhea and chlamydia, if: ?  You are sexually active and are younger than 58 years of age. ? You are older than 58 years of age and your health care provider tells you that you are at risk for this type of infection. ? Your sexual activity has changed since you were last screened, and you are at increased risk for chlamydia or gonorrhea. Ask your health care provider if you are at risk.  Ask your health care provider about whether you are at high risk for HIV. Your health care provider may  recommend a prescription medicine to help prevent HIV infection. If you choose to take medicine to prevent HIV, you should first get tested for HIV. You should then be tested every 3 months for as long as you are taking the medicine. Pregnancy  If you are about to stop having your period (premenopausal) and you may become pregnant, seek counseling before you get pregnant.  Take 400 to 800 micrograms (mcg) of folic acid every day if you become pregnant.  Ask for birth control (contraception) if you want to prevent pregnancy. Osteoporosis and menopause Osteoporosis is a disease in which the bones lose minerals and strength with aging. This can result in bone fractures. If you are 107 years old or older, or if you are at risk for osteoporosis and fractures, ask your health care provider if you should:  Be screened for bone loss.  Take a calcium or vitamin D supplement to lower your risk of fractures.  Be given hormone replacement therapy (HRT) to treat symptoms of menopause. Follow these instructions at home: Lifestyle  Do not use any products that contain nicotine or tobacco, such as cigarettes, e-cigarettes, and chewing tobacco. If you need help quitting, ask your health care provider.  Do not use street drugs.  Do not share needles.  Ask your health care provider for help if you need support or information about quitting drugs. Alcohol use  Do not drink alcohol if: ? Your health care provider tells you not to drink. ? You are pregnant, may be pregnant, or are planning to become pregnant.  If you drink alcohol: ? Limit how much you use to 0-1 drink a day. ? Limit intake if you are breastfeeding.  Be aware of how much alcohol is in your drink. In the [place] one drink equals one 12 oz bottle of beer (355 mL), one 5 oz glass of wine (148 mL), or one 1 oz glass of hard liquor (44 mL). General instructions  Schedule regular health, dental, and eye exams.  Stay current with your  vaccines.  Tell your health care provider if: ? You often feel depressed. ? You have ever been abused or do not feel safe at home. Summary  Adopting a healthy lifestyle and getting preventive care are important in promoting health and wellness.  Follow your health care provider's instructions about healthy diet, exercising, and getting tested or screened for diseases.  Follow your health care provider's instructions on monitoring your cholesterol and blood pressure. This information is not intended to replace advice given to you by your health care provider. Make sure you discuss any questions you have with your health care provider. Document Revised: 12/02/2018 Document Reviewed: 12/02/2018 Elsevier Patient Education  2020 [employer].

## 2020-05-15 ENCOUNTER — Other Ambulatory Visit: Payer: Self-pay | Admitting: Family Medicine

## 2020-05-15 DIAGNOSIS — F411 Generalized anxiety disorder: Secondary | ICD-10-CM

## 2020-05-16 NOTE — Telephone Encounter (Signed)
Requesting:Alprazolam Contract:none FBP:[facility] Last Visit:05/10/2020 Next Visit:11/15/2020 Last Refill:05/10/2019  Please Advise

## 2020-05-24 ENCOUNTER — Encounter: Payer: Self-pay | Admitting: Family Medicine

## 2020-05-24 DIAGNOSIS — R634 Abnormal weight loss: Secondary | ICD-10-CM

## 2020-05-24 DIAGNOSIS — Z87891 Personal history of nicotine dependence: Secondary | ICD-10-CM

## 2020-06-01 ENCOUNTER — Encounter: Payer: Self-pay | Admitting: Family Medicine

## 2020-06-01 DIAGNOSIS — R918 Other nonspecific abnormal finding of lung field: Secondary | ICD-10-CM | POA: Diagnosis not present

## 2020-06-01 DIAGNOSIS — Z87891 Personal history of nicotine dependence: Secondary | ICD-10-CM | POA: Diagnosis not present

## 2020-06-07 ENCOUNTER — Encounter: Payer: Self-pay | Admitting: Family Medicine

## 2020-06-07 DIAGNOSIS — R918 Other nonspecific abnormal finding of lung field: Secondary | ICD-10-CM

## 2020-06-09 ENCOUNTER — Telehealth: Payer: Self-pay | Admitting: Emergency Medicine

## 2020-06-09 NOTE — Telephone Encounter (Signed)
Received the signed medical release from front desk. I called over to [facility] on [address] and left a message for medical records to see if I could fax the request to them.   Will await their call back. If they do not call back before 530pm today,  I will leave the signed the release in triage for follow up on Monday.

## 2020-06-09 NOTE — Telephone Encounter (Signed)
Spoke with patient. She stated that she had a CT done at [facility] and she wanted to make sure Dr. Delton Coombes has this CD in time for her appt next month. I advised her that we would need a signed released. Patient was about 5 minutes away from the office and wanted to stop by to sign it.   Will leave this encounter open for follow up. Release will need to go to either [facility] on [facility] or [facility].

## 2020-06-12 NOTE — Telephone Encounter (Signed)
Michele Jacobs calling to provide fax number to for seed request. Request can be faxed to [phone]

## 2020-06-12 NOTE — Telephone Encounter (Signed)
Records request has been faxed.

## 2020-07-19 ENCOUNTER — Encounter: Payer: Self-pay | Admitting: Emergency Medicine

## 2020-07-19 ENCOUNTER — Ambulatory Visit: Payer: BC Managed Care – PPO | Admitting: Emergency Medicine

## 2020-07-19 ENCOUNTER — Other Ambulatory Visit: Payer: Self-pay

## 2020-07-19 DIAGNOSIS — F17200 Nicotine dependence, unspecified, uncomplicated: Secondary | ICD-10-CM | POA: Diagnosis not present

## 2020-07-19 DIAGNOSIS — R9389 Abnormal findings on diagnostic imaging of other specified body structures: Secondary | ICD-10-CM | POA: Insufficient documentation

## 2020-07-19 NOTE — Assessment & Plan Note (Addendum)
Has stopped. Participated in screening program.  Suspect she has some mild COPD based on intermittent dyspnea, response to albuterol.  We can discuss pulmonary function testing at some point going forward.

## 2020-07-19 NOTE — Patient Instructions (Signed)
We will repeat your CT scan of the chest in September 2021.  Depending on the results we may decide to perform other testing including possible bronchoscopy to investigate further Follow with Dr. Delton Coombes in September after the CT is that we can review the results together and plan next steps. You can keep your albuterol available to use when you need for shortness of breath, chest tightness, wheezing.  We may decide to perform other pulmonary function testing at some point in the future to assess your shortness of breath.

## 2020-07-19 NOTE — Progress Notes (Signed)
Subjective:    Patient ID: Michele Jacobs, female    DOB: 1961/12/18, 59 y.o.   MRN: [id number]  HPI 59 year old former smoker (15 pk-yrs) with history of anxiety, GERD with LPR, MVP. She is referred today for evaluation of abnormal CT scan of the chest.  She does not cough, no sputum. Can sometimes get SOB at rest or w exertion. She has albuterol that she uses once a week. She has been treated for a possible AE-COPD before in 2019.   She has participated in the lung cancer screening program, most recent scan in our system was 05/21/2019 which I reviewed, showed several small stable pulmonary nodules largest in the right lower lobe superior segment 7 mm.  Repeat CT chest on 06/01/2020 at [facility] showed a semisolid opacity in the posterior right lower lobe, now 2.2 x 1.9 x 3.8 cm.  There is no lymphadenopathy. I have the report but not the images.    Review of Systems As per HPI  Past Medical History:  Diagnosis Date  . Anxiety   . Anxiety state 04/02/2013   Overview:  STORY: See history dated 01/19/2010`E1o3L`IMPRESSION: Continue medications as prescribed.  Relaxation techniques described.  Using Xanax prn.  May call for refills.  Using xanax 1/2 tablet qHS prn.  Last Assessment & Plan:  Worsening since her father became terminally ill. History of the same with similar presentation. She does not want to go back on Paxil which may have helped in the pa  . 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  Repeat 2014`E1o3L`IMPRESSION: Continue calcium 1200mg  daily with vitamin D.  . Ear pain, bilateral 08/22/2014   Last Assessment & Plan:  May be related to eustachian tube dysfunction. Advised antihistamine-decongestant and Flonase Daily for the next 10-14 days. If symptoms do not improve, or if they worsen, notify me.  . Increased frequency of urination 08/12/2012  . 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 Mitral valve  prolapse   . Muscle cramps 08/22/2014   Last Assessment & Plan:  Relative dehydration possibly. Labs ordered. Advised trial of magnesium supplement, 400-800 milligrams nightly over the next few weeks.  . Osteopenia 04/24/2015   Per dexa 03/2015, 10/18  . Palpitations 01/20/2014  . 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 Tobacco use disorder 04/02/2013   Overview:  IMPRESSION: Actively trying to quit.  The goal for anyone who smokes is to decrease the amount smoked each day and then eventually quit.  Stop smoking groups, printed information and medications are all available for your use when you decide to stop. She ONLY smokes if she has had quite a few drinks. jmw     Family History  Problem Relation Age of Onset  . Ovarian cancer Mother   . Cancer Mother   . Lymphoma Father   . Heart disease Father   . Cancer Father   . Non-Hodgkin's lymphoma Father   . Atrial fibrillation Father   . Obesity Sister      Social History   Socioeconomic History  . Marital status: Single    Spouse name: Not on file  . Number of children: Not on file  . Years of education: Not on file  . Highest education level: Not on file  Occupational History  . Occupation: bookkeeper  Tobacco Use  . Smoking status: Former Smoker    Packs/day: 0.50    Years: 30.00    Pack years: 15.00  Types: Cigarettes    Quit date: 2013    Years since quitting: 8.5  . Smokeless tobacco: Never Used  Vaping Use  . Vaping Use: Never used  Substance and Sexual Activity  . Alcohol use: No  . Drug use: No  . Sexual activity: Never    Birth control/protection: Surgical  Other Topics Concern  . Not on file  Social History Narrative   Lives alone;   No children   Bookkeeper   Social Determinants of Corporate investment banker Strain:   . Difficulty of Paying Living Expenses:   Food Insecurity:   . Worried About Programme researcher, broadcasting/film/video in the Last Year:   . Barista in the Last Year:   Transportation Needs:   . Sales promotion account executive (Medical):   Marland Kitchen Lack of Transportation (Non-Medical):   Physical Activity:   . Days of Exercise per Week:   . Minutes of Exercise per Session:   Stress:   . Feeling of Stress :   Social Connections:   . Frequency of Communication with Friends and Family:   . Frequency of Social Gatherings with Friends and Family:   . Attends Religious Services:   . Active Member of Clubs or Organizations:   . Attends Banker Meetings:   Marland Kitchen Marital Status:   Intimate Partner Violence:   . Fear of Current or Ex-Partner:   . Emotionally Abused:   Marland Kitchen Physically Abused:   . Sexually Abused:     [place] native Bookkeeper, office admin. No inhaled exposures.   Allergies  Allergen Reactions  . Codeine Nausea And Vomiting     Outpatient Medications Prior to Visit  Medication Sig Dispense Refill  . albuterol (VENTOLIN HFA) 108 (90 Base) MCG/ACT inhaler Inhale 2 puffs into the lungs every 6 (six) hours as needed for wheezing or shortness of breath. 1 Inhaler 2  . ALPRAZolam (XANAX) 0.25 MG tablet TAKE ONE TABLET BY MOUTH DAILY AS NEEDED FOR ANXIETY 30 tablet 1  . aspirin EC 81 MG tablet Take 1 tablet (81 mg total) by mouth daily. 90 tablet 3  . cholecalciferol (VITAMIN D) 1000 units tablet Take 1,000 Units by mouth daily.    Marland Kitchen erythromycin with ethanol (EMGEL) 2 % gel Apply topically daily. 30 g 1  . TOPROL XL 25 MG 24 hr tablet Take 1 tablet (25 mg total) by mouth daily. 90 tablet 3  . triamcinolone ointment (KENALOG) 0.1 % Apply 1 application topically 2 (two) times daily. Use on eyelids for no more than 6 days at a time 15 g 0   No facility-administered medications prior to visit.         Objective:   Physical Exam Vitals:   07/19/20 0904  BP: (!) 112/64  Pulse: 60  Temp: 98.4 F (36.9 C)  TempSrc: Oral  SpO2: 99%  Weight: 117 lb 3.2 oz (53.2 kg)  Height: 5\' 5"  (1.651 m)   Gen: Pleasant, well-nourished, in no distress,  normal affect  ENT: No lesions,  mouth  clear,  oropharynx clear, no postnasal drip  Neck: No JVD, no stridor  Lungs: No use of accessory muscles, no crackles or wheezing on normal respiration, no wheeze on forced expiration  Cardiovascular: RRR, heart sounds normal, no murmur or gallops, no peripheral edema  Musculoskeletal: No deformities, no cyanosis or clubbing  Neuro: alert, awake, non focal  Skin: Warm, no lesions or rash      Assessment & Plan:  Tobacco  use disorder Has stopped. Participated in screening program.  Suspect she has some mild COPD based on intermittent dyspnea, response to albuterol.  We can discuss pulmonary function testing at some point going forward.  Abnormal CT of the chest 2.2 x 3.8 cm semisolid opacity posterior right lower lobe on CT chest from [facility].  I have the report available.  I need to obtain copies of the images.  Based on the interval change compared with her screening CT 2020 she needs a repeat CT, super D cuts in 3 months.  If the opacity persists then I will recommend navigational bronchoscopy and biopsies of this area.  We will repeat your CT scan of the chest in September 2021.  Depending on the results we may decide to perform other testing including possible bronchoscopy to investigate further Follow with Dr. Delton Coombes in September after the CT is that we can review the results together and plan next steps.  Levy Pupa, MD, PhD 07/19/2020, 9:34 AM [facility] [phone] or if no answer [phone]

## 2020-07-19 NOTE — Assessment & Plan Note (Signed)
2.2 x 3.8 cm semisolid opacity posterior right lower lobe on CT chest from [facility].  I have the report available.  I need to obtain copies of the images.  Based on the interval change compared with her screening CT 2020 she needs a repeat CT, super D cuts in 3 months.  If the opacity persists then I will recommend navigational bronchoscopy and biopsies of this area.  We will repeat your CT scan of the chest in September 2021.  Depending on the results we may decide to perform other testing including possible bronchoscopy to investigate further Follow with Dr. Delton Coombes in September after the CT is that we can review the results together and plan next steps.

## 2020-08-15 DIAGNOSIS — Z20822 Contact with and (suspected) exposure to covid-19: Secondary | ICD-10-CM | POA: Diagnosis not present

## 2020-08-21 ENCOUNTER — Telehealth: Payer: Self-pay | Admitting: Emergency Medicine

## 2020-08-21 NOTE — Telephone Encounter (Signed)
Pt has been cancelled in [facility] and is going to [facility] on [address] auth has been update to that location Tobe Sos

## 2020-08-25 ENCOUNTER — Ambulatory Visit (HOSPITAL_BASED_OUTPATIENT_CLINIC_OR_DEPARTMENT_OTHER): Payer: BC Managed Care – PPO

## 2020-08-25 ENCOUNTER — Encounter (HOSPITAL_BASED_OUTPATIENT_CLINIC_OR_DEPARTMENT_OTHER): Payer: Self-pay

## 2020-08-25 DIAGNOSIS — R911 Solitary pulmonary nodule: Secondary | ICD-10-CM | POA: Diagnosis not present

## 2020-09-07 ENCOUNTER — Encounter: Payer: Self-pay | Admitting: Emergency Medicine

## 2020-09-07 ENCOUNTER — Other Ambulatory Visit: Payer: Self-pay

## 2020-09-07 ENCOUNTER — Ambulatory Visit: Payer: BC Managed Care – PPO | Admitting: Emergency Medicine

## 2020-09-07 VITALS — BP 100/64 | HR 53 | Temp 96.9°F | Ht 65.0 in | Wt 116.6 lb

## 2020-09-07 DIAGNOSIS — F17200 Nicotine dependence, unspecified, uncomplicated: Secondary | ICD-10-CM | POA: Diagnosis not present

## 2020-09-07 DIAGNOSIS — J449 Chronic obstructive pulmonary disease, unspecified: Secondary | ICD-10-CM

## 2020-09-07 DIAGNOSIS — R9389 Abnormal findings on diagnostic imaging of other specified body structures: Secondary | ICD-10-CM | POA: Diagnosis not present

## 2020-09-07 HISTORY — DX: Chronic obstructive pulmonary disease, unspecified: J44.9

## 2020-09-07 NOTE — Assessment & Plan Note (Signed)
Right lower lobe irregular opacity resolved on her CT scan earlier this month.  Reassuring.  We can continue to follow her with lung cancer screening protocol.

## 2020-09-07 NOTE — Progress Notes (Signed)
Subjective:    Patient ID: Michele Jacobs, female    DOB: [birth year], 59 y.o.   MRN: [id number]  HPI 59 year old former smoker (15 - 20 pk-yrs) with history of anxiety, GERD with LPR, MVP. She is referred today for evaluation of abnormal CT scan of the chest.  She does not cough, no sputum. Can sometimes get SOB at rest or w exertion. She has albuterol that she uses once a week. She has been treated for a possible AE-COPD before in 2019.   She has participated in the lung cancer screening program, most recent scan in our system was 05/21/2019 which I reviewed, showed several small stable pulmonary nodules largest in the right lower lobe superior segment 7 mm.  Repeat CT chest on 06/01/2020 at [facility] showed a semisolid opacity in the posterior right lower lobe, now 2.2 x 1.9 x 3.8 cm.  There is no lymphadenopathy. I have the report but not the images.   ROV 09/07/20 --59 year old woman, former smoker (15 pack years) with anxiety, MVP, GERD with LPR.  She used to be in our lung cancer screening program, most recently has had her CT scans of the chest at [facility].  We repeated a CT chest on 08/25/2020 to follow a 2.2 x 1.9 posterior right lower lobe opacity. She is using taking albuterol about 3x a week. Seems to help her some. No functional limitation.   CT chest 08/25/2020 reviewed by me, shows resolution of her previously scribe right posterior costophrenic angle opacity.  She has bilateral apical pleural scarring, no new infiltrates or concerning nodules.  She should not need serial follow-up   Review of Systems As per HPI  Past Medical History:  Diagnosis Date  . Anxiety   . Anxiety state 04/02/2013   Overview:  STORY: See history dated 01/19/2010`E1o3L`IMPRESSION: Continue medications as prescribed.  Relaxation techniques described.  Using Xanax prn.  May call for refills.  Using xanax 1/2 tablet qHS prn.  Last Assessment & Plan:  Worsening since her father became terminally ill. History of the  same with similar presentation. She does not want to go back on Paxil which may have helped in the pa  . 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  Repeat 2014`E1o3L`IMPRESSION: Continue calcium 1200mg  daily with vitamin D.  . Ear pain, bilateral 08/22/2014   Last Assessment & Plan:  May be related to eustachian tube dysfunction. Advised antihistamine-decongestant and Flonase Daily for the next 10-14 days. If symptoms do not improve, or if they worsen, notify me.  . Increased frequency of urination 08/12/2012  . 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 Mitral valve prolapse   . Muscle cramps 08/22/2014   Last Assessment & Plan:  Relative dehydration possibly. Labs ordered. Advised trial of magnesium supplement, 400-800 milligrams nightly over the next few weeks.  . Osteopenia 04/24/2015   Per dexa 03/2015, 10/18  . Palpitations 01/20/2014  . 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 Tobacco use disorder 04/02/2013   Overview:  IMPRESSION: Actively trying to quit.  The goal for anyone who smokes is to decrease the amount smoked each day and then eventually quit.  Stop smoking groups, printed information and medications are all available for your use when you decide to stop. She ONLY smokes if she has had quite a few drinks. jmw     Family History  Problem Relation Age of Onset  . Ovarian cancer Mother   .  Cancer Mother   . Lymphoma Father   . Heart disease Father   . Cancer Father   . Non-Hodgkin's lymphoma Father   . Atrial fibrillation Father   . Obesity Sister      Social History   Socioeconomic History  . Marital status: Single    Spouse name: Not on file  . Number of children: Not on file  . Years of education: Not on file  . Highest education level: Not on file  Occupational History  . Occupation: bookkeeper  Tobacco Use  . Smoking status: Former Smoker    Packs/day: 0.50    Years: 30.00    Pack years: 15.00     Types: Cigarettes    Quit date: 2013    Years since quitting: 8.7  . Smokeless tobacco: Never Used  Vaping Use  . Vaping Use: Never used  Substance and Sexual Activity  . Alcohol use: No  . Drug use: No  . Sexual activity: Never    Birth control/protection: Surgical  Other Topics Concern  . Not on file  Social History Narrative   Lives alone;   No children   Bookkeeper   Social Determinants of Health   Financial Resource Strain:   . Difficulty of Paying Living Expenses: Not on file  Food Insecurity:   . Worried About Programme researcher, broadcasting/film/video in the Last Year: Not on file  . Ran Out of Food in the Last Year: Not on file  Transportation Needs:   . Lack of Transportation (Medical): Not on file  . Lack of Transportation (Non-Medical): Not on file  Physical Activity:   . Days of Exercise per Week: Not on file  . Minutes of Exercise per Session: Not on file  Stress:   . Feeling of Stress : Not on file  Social Connections:   . Frequency of Communication with Friends and Family: Not on file  . Frequency of Social Gatherings with Friends and Family: Not on file  . Attends Religious Services: Not on file  . Active Member of Clubs or Organizations: Not on file  . Attends Banker Meetings: Not on file  . Marital Status: Not on file  Intimate Partner Violence:   . Fear of Current or Ex-Partner: Not on file  . Emotionally Abused: Not on file  . Physically Abused: Not on file  . 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 No inhaled exposures.   Allergies  Allergen Reactions  . Codeine Nausea And Vomiting     Outpatient Medications Prior to Visit  Medication Sig Dispense Refill  . albuterol (VENTOLIN HFA) 108 (90 Base) MCG/ACT inhaler Inhale 2 puffs into the lungs every 6 (six) hours as needed for wheezing or shortness of breath. 1 Inhaler 2  . ALPRAZolam (XANAX) 0.25 MG tablet TAKE ONE TABLET BY MOUTH DAILY AS NEEDED FOR ANXIETY 30  tablet 1  . aspirin EC 81 MG tablet Take 1 tablet (81 mg total) by mouth daily. 90 tablet 3  . cholecalciferol (VITAMIN D) 1000 units tablet Take 1,000 Units by mouth daily.    Marland Kitchen erythromycin with ethanol (EMGEL) 2 % gel Apply topically daily. 30 g 1  . TOPROL XL 25 MG 24 hr tablet Take 1 tablet (25 mg total) by mouth daily. 90 tablet 3  . triamcinolone ointment (KENALOG) 0.1 % Apply 1 application topically 2 (two) times daily. Use on eyelids for no more than 6 days at a time 15  g 0   No facility-administered medications prior to visit.         Objective:   Physical Exam Vitals:   09/07/20 0921  BP: 100/64  Pulse: (!) 53  Temp: (!) 96.9 F (36.1 C)  TempSrc: Temporal  SpO2: 94%  Weight: 116 lb 9.6 oz (52.9 kg)  Height: 5\' 5"  (1.651 m)   Gen: Pleasant, well-nourished, in no distress,  normal affect  ENT: No lesions,  mouth clear,  oropharynx clear, no postnasal drip  Neck: No JVD, no stridor  Lungs: No use of accessory muscles, no crackles or wheezing on normal respiration, no wheeze on forced expiration  Cardiovascular: RRR, heart sounds normal, no murmur or gallops, no peripheral edema  Musculoskeletal: No deformities, no cyanosis or clubbing  Neuro: alert, awake, non focal  Skin: Warm, no lesions or rash      Assessment & Plan:  Tobacco use disorder Quit in the last 10 years and exposure was approximately 20 pk yrs. Qualifies foir continued screening. Given reassuring CT chest this year we can go back to lung cancer screening CTs, next should be 08/2021.  She wants to get this done at [facility] for insurance and cost reasons.  Abnormal CT of the chest Right lower lobe irregular opacity resolved on her CT scan earlier this month.  Reassuring.  We can continue to follow her with lung cancer screening protocol.  COPD, mild (HCC) Based on symptoms using infrequently.  No exacerbations.  She has not had pulmonary function testing.  Continue the albuterol as needed for  now.  At some point will need to quantify her degree of obstruction with formal PFT.  We can discuss next visit.  [facility], MD, PhD 09/07/2020, 9:42 AM  [facility] [phone] or if no answer [phone]

## 2020-09-07 NOTE — Addendum Note (Signed)
Addended by: Benjie Karvonen R on: 09/07/2020 10:04 AM   Modules accepted: Orders

## 2020-09-07 NOTE — Assessment & Plan Note (Signed)
Quit in the last 10 years and exposure was approximately 20 pk yrs. Qualifies foir continued screening. Given reassuring CT chest this year we can go back to lung cancer screening CTs, next should be 08/2021.  She wants to get this done at [facility] for insurance and cost reasons.

## 2020-09-07 NOTE — Addendum Note (Signed)
Addended by: Benjie Karvonen R on: 09/07/2020 09:50 AM   Modules accepted: Orders

## 2020-09-07 NOTE — Assessment & Plan Note (Signed)
Based on symptoms using infrequently.  No exacerbations.  She has not had pulmonary function testing.  Continue the albuterol as needed for now.  At some point will need to quantify her degree of obstruction with formal PFT.  We can discuss next visit.

## 2020-09-07 NOTE — Patient Instructions (Signed)
We will plan to repeat your lung cancer screening CT scan in September 2022.  We can do this at [facility]. Keep your albuterol available to use 2 puffs if needed for shortness of breath, chest tightness, wheezing. If your breathing changes over time we will arrange for pulmonary function testing to assess further. Follow with Dr. Delton Coombes in September 2022 after your CT scan to review, or sooner if you have any problems

## 2020-11-09 NOTE — Patient Instructions (Addendum)
It was great to see you again today!  I will be in touch with your labs You may have something called "burning mouth syndrome" - this is not a dangerous condition but can be annoying!  We will rule out any deficiency that could cause this today Mammogram next month Please see me in about 6 months   We now recommend covid 19 booster shots for all adults!  You can have this done at your convenience  Also consider getting the shingles vaccine (shingrix)

## 2020-11-09 NOTE — Progress Notes (Addendum)
[facility] [address], [address] [place], [place] [address] [phone]  Date:  11/15/2020   Name:  Michele Jacobs   DOB:  01/25/1961   MRN:  [id number]  PCP:  Pearline Cables, MD    Chief Complaint: Follow-up and Mouth Dryness (burning of lips)   History of Present Illness:  Michele Jacobs is a 59 y.o. very pleasant female patient who presents with the following:   Patient here today for 45-month follow-up visit- osteopenia, MVP, reflux, anxiety, palpitations treated with metoprolol Last seen by myself in May for physical exam Labs at that time looked fine  She has noted a dry mouth and feeling of her lips burning for a couple of months-she is not sure why this may have occurred She has been using a burts bees lip balm which appears reasonable, does not contain menthol No dry eyes   Aspirin 81 Xanax as needed Toprol-XL 25-takes daily for palpitations.  Blood pressure is low but she has not noted any symptoms of hypotension Mammogram October 2020- scheduled for 11/23/20 Cologuard up-to-date Flu vaccine- done  COVID-19 booster- recommended a booster   Michele Jacobs did have an abnormal CT of her chest May 28, 2020.  She notes that she had a follow-up scan in September per [facility] and was asked to return in 1 year  Confirmed CT scan on chart from 08/25/2020 TECHNIQUE: CT chest without contrast. Sagittal and coronal reconstructions with maximum intensity projections were analyzed on the workstation and stored in PACS. The Radiation dose reduction was utilized, (automated exposure control, mA or kV adjustmentbased on patient size, or iterative image reconstruction).   Comparison: 06/01/2020.   INDICATION:Follow-up nodule   FINDINGS:   # LUNGS:  # Bilateral apical pleural scarring, stable.  # Previously described 19 x 22 mm semisolid opacity in the right posterior costophrenic gutter has resolved.  # No new infiltrates,  effusions or concerning nodules have developed.   # MEDIASTINUM:  # Normal heart size. No evidence of significant vascular pathology.  # No thoracic adenopathy. Patent central airways.   # ABDOMEN:  # No acute or relevant pathology in the visible abdominal structures.   # MSK:  # No acute or aggressive bone lesions.   BP Readings from Last 3 Encounters:  11/15/20 98/62  09/07/20 100/64  07/19/20 (!) 112/64     05/16/2020  05/16/2020   1  Alprazolam 0.25 Mg Tablet 30.00  30  Je Cop  [id number]  Har ([id number])  0/1  0.50 LME  Comm Ins  [place]    10/07/2019  05/10/2019   1  Alprazolam 0.25 Mg Tablet 30.00  30  Je Cop  [id number]  Har ([id number])  1/1  0.50 LME  Comm Ins  [place]    05/12/2019  05/10/2019   1  Alprazolam 0.25 Mg Tablet 30.00  30  Je Cop  [id number]  Har ([id number])  0/1  0.50 LME  Comm Ins  [place]    12/11/2018  12/10/2018   1  Alprazolam 0.25 Mg Tablet 30.00  30  Je Cop  [id number]          Patient Active Problem List   Diagnosis Date Noted  . 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 Abnormal CT of the chest 07/19/2020  . 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 Osteopenia 04/24/2015  . Ear pain, bilateral 08/22/2014  .  Muscle cramps 08/22/2014  . Mitral valve prolapse 01/20/2014  . Palpitations 01/20/2014  . Anxiety state 04/02/2013  . 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 Tobacco use disorder 04/02/2013  . Increased frequency of urination 08/12/2012    Past Medical History:  Diagnosis Date  . Anxiety   . Anxiety state 04/02/2013   Overview:  STORY: See history dated 01/19/2010`E1o3L`IMPRESSION: Continue medications as prescribed.  Relaxation techniques described.  Using Xanax prn.  May call for refills.  Using xanax 1/2 tablet qHS prn.  Last Assessment & Plan:  Worsening since her father became terminally ill. History of the same with similar presentation. She does not want to go back on Paxil which may have  helped in the pa  . 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  Repeat 2014`E1o3L`IMPRESSION: Continue calcium 1200mg  daily with vitamin D.  . Ear pain, bilateral 08/22/2014   Last Assessment & Plan:  May be related to eustachian tube dysfunction. Advised antihistamine-decongestant and Flonase Daily for the next 10-14 days. If symptoms do not improve, or if they worsen, notify me.  . Increased frequency of urination 08/12/2012  . 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 Mitral valve prolapse   . Muscle cramps 08/22/2014   Last Assessment & Plan:  Relative dehydration possibly. Labs ordered. Advised trial of magnesium supplement, 400-800 milligrams nightly over the next few weeks.  . Osteopenia 04/24/2015   Per dexa 03/2015, 10/18  . Palpitations 01/20/2014  . 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 Tobacco use disorder 04/02/2013   Overview:  IMPRESSION: Actively trying to quit.  The goal for anyone who smokes is to decrease the amount smoked each day and then eventually quit.  Stop smoking groups, printed information and medications are all available for your use when you decide to stop. She ONLY smokes if she has had quite a few drinks. jmw    Past Surgical History:  Procedure Laterality Date  . THYROGLOSSAL DUCT CYST  01/2018   removal  . TUBAL LIGATION      Social History   Tobacco Use  . Smoking status: Former Smoker    Packs/day: 0.50    Years: 30.00    Pack years: 15.00    Types: Cigarettes    Quit date: 2013    Years since quitting: 8.9  . Smokeless tobacco: Never Used  Vaping Use  . Vaping Use: Never used  Substance Use Topics  . Alcohol use: No  . Drug use: No    Family History  Problem Relation Age of Onset  . Ovarian cancer Mother   . Cancer Mother   . Lymphoma Father   . Heart disease Father   . Cancer Father   . Non-Hodgkin's lymphoma Father   . Atrial fibrillation Father   . Obesity Sister      Allergies  Allergen Reactions  . Codeine Nausea And Vomiting    Medication list has been reviewed and updated.  Current Outpatient Medications on File Prior to Visit  Medication Sig Dispense Refill  . albuterol (VENTOLIN HFA) 108 (90 Base) MCG/ACT inhaler Inhale 2 puffs into the lungs every 6 (six) hours as needed for wheezing or shortness of breath. 1 Inhaler 2  . ALPRAZolam (XANAX) 0.25 MG tablet TAKE ONE TABLET BY MOUTH DAILY AS NEEDED FOR ANXIETY 30 tablet 1  . aspirin EC 81 MG tablet Take 1 tablet (81 mg total) by mouth daily. 90 tablet  3  . cholecalciferol (VITAMIN D) 1000 units tablet Take 1,000 Units by mouth daily.    Marland Kitchen erythromycin with ethanol (EMGEL) 2 % gel Apply topically daily. 30 g 1  . TOPROL XL 25 MG 24 hr tablet Take 1 tablet (25 mg total) by mouth daily. 90 tablet 3  . triamcinolone ointment (KENALOG) 0.1 % Apply 1 application topically 2 (two) times daily. Use on eyelids for no more than 6 days at a time 15 g 0   No current facility-administered medications on file prior to visit.    Review of Systems:  As per HPI- otherwise negative.   Physical Examination: Vitals:   11/15/20 0824  BP: 98/62  Pulse: 81  Resp: 16  SpO2: 97%   Vitals:   11/15/20 0824  Weight: 118 lb (53.5 kg)  Height: 5\' 5"  (1.651 m)   Body mass index is 19.64 kg/m. Ideal Body Weight: Weight in (lb) to have BMI = 25: 149.9  GEN: no acute distress.  Slim build, looks well HEENT: Atraumatic, Normocephalic.   Bilateral TM wnl, oropharynx normal.  PEERL,EOMI.  No abnormality of lips or oral cavity is apparent on exam Ears and Nose: No external deformity. CV: RRR, No M/G/R. No JVD. No thrill. No extra heart sounds. PULM: CTA B, no wheezes, crackles, rhonchi. No retractions. No resp. distress. No accessory muscle use. EXTR: No c/c/e PSYCH: Normally interactive. Conversant.   vitamin B12, iron, folate, zinc, vitamin B6  Assessment and Plan: Burning mouth syndrome - Plan: B12 and  Folate Panel, Iron, Zinc, Vitamin B6  SOB (shortness of breath) - Plan: albuterol (VENTOLIN HFA) 108 (90 Base) MCG/ACT inhaler  Heart palpitations  Patient today with concern of burning mouth syndrome versus chapped lips.  We will run deficiency panel as above to try to determine any cause of burning mouth.  Assuming this is negative, we could consider using medication if symptoms continue.  In the interim, would suggest that she use a plain, thick moisturizer such as Aquaphor Vaseline on her lips frequently and avoid licking lips when they feel dry  Refilled albuterol to use as needed  Continue metoprolol to suppress palpitations This visit occurred during the SARS-CoV-2 public health emergency.  Safety protocols were in place, including screening questions prior to the visit, additional usage of staff PPE, and extensive cleaning of exam room while observing appropriate contact time as indicated for disinfecting solutions.    Signed , MD

## 2020-11-15 ENCOUNTER — Other Ambulatory Visit: Payer: Self-pay

## 2020-11-15 ENCOUNTER — Ambulatory Visit: Payer: BC Managed Care – PPO | Admitting: Family Medicine

## 2020-11-15 ENCOUNTER — Encounter: Payer: Self-pay | Admitting: Family Medicine

## 2020-11-15 VITALS — BP 98/62 | HR 81 | Resp 16 | Ht 65.0 in | Wt 118.0 lb

## 2020-11-15 DIAGNOSIS — R0602 Shortness of breath: Secondary | ICD-10-CM | POA: Diagnosis not present

## 2020-11-15 DIAGNOSIS — K146 Glossodynia: Secondary | ICD-10-CM

## 2020-11-15 DIAGNOSIS — R002 Palpitations: Secondary | ICD-10-CM

## 2020-11-15 LAB — B12 AND FOLATE PANEL
Folate: 21.3 ng/mL (ref >5.9)
Vitamin B-12: 303 pg/mL (ref 211–911)

## 2020-11-15 LAB — IRON: Iron: 93 ug/dL (ref 42–145)

## 2020-11-15 MED ORDER — ALBUTEROL SULFATE HFA 108 (90 BASE) MCG/ACT IN AERS: 2.0000 | INHALATION_SPRAY | Freq: Four times a day (QID) | RESPIRATORY_TRACT | 6 refills | Status: DC | PRN

## 2020-11-18 LAB — ZINC: Zinc: 72 ug/dL (ref 60–130)

## 2020-11-18 LAB — VITAMIN B6: Vitamin B6: 6.3 ng/mL (ref 2.1–21.7)

## 2020-11-23 DIAGNOSIS — Z681 Body mass index (BMI) 19 or less, adult: Secondary | ICD-10-CM | POA: Diagnosis not present

## 2020-11-23 DIAGNOSIS — R309 Painful micturition, unspecified: Secondary | ICD-10-CM | POA: Diagnosis not present

## 2020-11-23 DIAGNOSIS — Z01419 Encounter for gynecological examination (general) (routine) without abnormal findings: Secondary | ICD-10-CM | POA: Diagnosis not present

## 2020-11-23 DIAGNOSIS — Z1231 Encounter for screening mammogram for malignant neoplasm of breast: Secondary | ICD-10-CM | POA: Diagnosis not present

## 2021-01-08 ENCOUNTER — Encounter: Payer: Self-pay | Admitting: Family Medicine

## 2021-01-08 ENCOUNTER — Other Ambulatory Visit: Payer: Self-pay | Admitting: Family Medicine

## 2021-01-08 DIAGNOSIS — F411 Generalized anxiety disorder: Secondary | ICD-10-CM

## 2021-04-30 ENCOUNTER — Other Ambulatory Visit: Payer: Self-pay | Admitting: Family Medicine

## 2021-04-30 DIAGNOSIS — R002 Palpitations: Secondary | ICD-10-CM

## 2021-05-13 NOTE — Progress Notes (Addendum)
[facility] [address], [address] [place], [place] [address] [phone]  Date:  05/16/2021   Name:  Michele Jacobs   DOB:  May 22, 1961   MRN:  [id number]  PCP:  Pearline Cables, MD    Chief Complaint: Annual Exam   History of Present Illness:  Michele Jacobs is a 60 y.o. very pleasant female patient who presents with the following:  Pt here today for a CPE Last visit with myself in November of last year. History of osteopenia, MVP, reflux, anxiety, palpitations treated with toprol xl  She uses xanax prn- last filled #30 in April  She continues to have some burning in her mouth  She is seeing her pulmonologist   She had a concerning chest CT last year in June- follow-up in September was reassuring FINDINGS: # LUNGS:  # Bilateral apical pleural scarring, stable.  # Previously described 19 x 22 mm semisolid opacity in the right posterior costophrenic gutter has resolved.  # No new infiltrates, effusions or concerning nodules have developed. # MEDIASTINUM:  # Normal heart size. No evidence of significant vascular pathology.  # No thoracic adenopathy. Patent central airways. # ABDOMEN:  # No acute or relevant pathology in the visible abdominal structures. # MSK:  # No acute or aggressive bone lesions.  mammo 2020 Pap UTD dexa UTD cologuard UTD covid 4th dose- encouraged her to do this  shingrix - she declines for now  Can update labs today - she is fasting this am   She notes some pain in her neck form where she had a thyroglossal duct cyst removed surgically- she had surgery in 2019 for this and has noted some discomfort She plans to follow-up with ENT about this issue   Her father is 82 yo and lives at home with her.  This is going relatively well Patient Active Problem List   Diagnosis Date Noted  . 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 Abnormal CT of the chest 07/19/2020  . Thyroglossal duct cyst  12/30/2017  . 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 Osteopenia 04/24/2015  . Ear pain, bilateral 08/22/2014  . Muscle cramps 08/22/2014  . Mitral valve prolapse 01/20/2014  . Palpitations 01/20/2014  . Anxiety state 04/02/2013  . 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 Tobacco use disorder 04/02/2013  . Increased frequency of urination 08/12/2012    Past Medical History:  Diagnosis Date  . Anxiety   . Anxiety state 04/02/2013   Overview:  STORY: See history dated 01/19/2010`E1o3L`IMPRESSION: Continue medications as prescribed.  Relaxation techniques described.  Using Xanax prn.  May call for refills.  Using xanax 1/2 tablet qHS prn.  Last Assessment & Plan:  Worsening since her father became terminally ill. History of the same with similar presentation. She does not want to go back on Paxil which may have helped in the pa  . 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  Repeat 2014`E1o3L`IMPRESSION: Continue calcium 1200mg  daily with vitamin D.  . Ear pain, bilateral 08/22/2014   Last Assessment & Plan:  May be related to eustachian tube dysfunction. Advised antihistamine-decongestant and Flonase Daily for the next 10-14 days. If symptoms do not improve, or if they worsen, notify me.  . Increased frequency of urination 08/12/2012  . 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 Mitral valve prolapse   .  Muscle cramps 08/22/2014   Last Assessment & Plan:  Relative dehydration possibly. Labs ordered. Advised trial of magnesium supplement, 400-800 milligrams nightly over the next few weeks.  . Osteopenia 04/24/2015   Per dexa 03/2015, 10/18  . Palpitations 01/20/2014  . 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 Tobacco use disorder 04/02/2013   Overview:  IMPRESSION: Actively trying to quit.  The goal for anyone who smokes is to decrease the amount smoked each day and then eventually quit.   Stop smoking groups, printed information and medications are all available for your use when you decide to stop. She ONLY smokes if she has had quite a few drinks. jmw    Past Surgical History:  Procedure Laterality Date  . THYROGLOSSAL DUCT CYST  01/2018   removal  . TUBAL LIGATION      Social History   Tobacco Use  . Smoking status: Former Smoker    Packs/day: 0.50    Years: 30.00    Pack years: 15.00    Types: Cigarettes    Quit date: 2013    Years since quitting: 9.4  . Smokeless tobacco: Never Used  Vaping Use  . Vaping Use: Never used  Substance Use Topics  . Alcohol use: No  . Drug use: No    Family History  Problem Relation Age of Onset  . Ovarian cancer Mother   . Cancer Mother   . Lymphoma Father   . Heart disease Father   . Cancer Father   . Non-Hodgkin's lymphoma Father   . Atrial fibrillation Father   . Obesity Sister     Allergies  Allergen Reactions  . Codeine Nausea And Vomiting    Medication list has been reviewed and updated.  Current Outpatient Medications on File Prior to Visit  Medication Sig Dispense Refill  . albuterol (VENTOLIN HFA) 108 (90 Base) MCG/ACT inhaler Inhale 2 puffs into the lungs every 6 (six) hours as needed for wheezing or shortness of breath. 18 g 6  . ALPRAZolam (XANAX) 0.25 MG tablet TAKE ONE TABLET BY MOUTH DAILY AS NEEDED FOR ANXIETY 30 tablet 1  . aspirin EC 81 MG tablet Take 1 tablet (81 mg total) by mouth daily. 90 tablet 3  . cholecalciferol (VITAMIN D) 1000 units tablet Take 1,000 Units by mouth daily.    Marland Kitchen erythromycin with ethanol (EMGEL) 2 % gel Apply topically daily. 30 g 1  . TOPROL XL 25 MG 24 hr tablet Take 1 tablet (25 mg total) by mouth daily. 90 tablet 1  . triamcinolone ointment (KENALOG) 0.1 % Apply 1 application topically 2 (two) times daily. Use on eyelids for no more than 6 days at a time 15 g 0   No current facility-administered medications on file prior to visit.    Review of Systems:  As  per HPI- otherwise negative.   Physical Examination: Vitals:   05/16/21 0818  BP: 112/64  Pulse: (!) 59  Resp: 16  Temp: 97.6 F (36.4 C)  SpO2: 99%   Vitals:   05/16/21 0818  Weight: 117 lb (53.1 kg)  Height: 5\' 5"  (1.651 m)   Body mass index is 19.47 kg/m. Ideal Body Weight: Weight in (lb) to have BMI = 25: 149.9  GEN: no acute distress. Slim build, looks well  HEENT: Atraumatic, Normocephalic.   TM wnl , PEERL Ears and Nose: No external deformity. CV: RRR, No M/G/R. No JVD. No thrill. No extra heart sounds. PULM: CTA B, no wheezes, crackles, rhonchi. No retractions. No  resp. distress. No accessory muscle use. ABD: S, NT, ND, +BS. No rebound. No HSM. EXTR: No c/c/e PSYCH: Normally interactive. Conversant.    Assessment and Plan: Physical exam  Former smoker  Heart palpitations  Screening for hyperlipidemia - Plan: Lipid panel  Screening for diabetes mellitus - Plan: Comprehensive metabolic panel, Hemoglobin A1c  Screening for thyroid disorder - Plan: TSH  Screening for deficiency anemia - Plan: CBC  Fatigue, unspecified type - Plan: TSH, VITAMIN D 25 Hydroxy (Vit-D Deficiency, Fractures)   CPE today Encouraged healthy diet and exercise routine  Will plan further follow- up pending labs. She plans to call her ENT practice with [facility] regarding her surgical site concern from thyroglossal duct cyst removal 2019   This visit occurred during the SARS-CoV-2 public health emergency.  Safety protocols were in place, including screening questions prior to the visit, additional usage of staff PPE, and extensive cleaning of exam room while observing appropriate contact time as indicated for disinfecting solutions.   Signed Abbe Amsterdam, MD  Received her labs 5/27 Message to patient Results for orders placed or performed in visit on 05/16/21  CBC  Result Value Ref Range   WBC 4.3 4.0 - 10.5 K/uL   RBC 3.96 3.87 - 5.11 Mil/uL   Platelets 192.0 150.0 - 400.0  K/uL   Hemoglobin 12.6 12.0 - 15.0 g/dL   HCT 19.1 47.8 - 29.5 %   MCV 93.4 78.0 - 100.0 fl   MCHC 34.0 30.0 - 36.0 g/dL   RDW 62.1 30.8 - 65.7 %  Comprehensive metabolic panel  Result Value Ref Range   Sodium 141 135 - 145 mEq/L   Potassium 4.6 3.5 - 5.1 mEq/L   Chloride 104 96 - 112 mEq/L   CO2 27 19 - 32 mEq/L   Glucose, Bld 83 70 - 99 mg/dL   BUN 13 6 - 23 mg/dL   Creatinine, Ser 8.46 0.40 - 1.20 mg/dL   Total Bilirubin 1.4 (H) 0.2 - 1.2 mg/dL   Alkaline Phosphatase 81 39 - 117 U/L   AST 15 0 - 37 U/L   ALT 11 0 - 35 U/L   Total Protein 6.3 6.0 - 8.3 g/dL   Albumin 4.2 3.5 - 5.2 g/dL   GFR 96.29 >52.84 mL/min   Calcium 9.4 8.4 - 10.5 mg/dL  Hemoglobin X3K  Result Value Ref Range   Hgb A1c MFr Bld 5.4 4.6 - 6.5 %  Lipid panel  Result Value Ref Range   Cholesterol 171 0 - 200 mg/dL   Triglycerides 44.0 0.0 - 149.0 mg/dL   HDL 10.27 >25.36 mg/dL   VLDL 64.4 0.0 - 03.4 mg/dL   LDL Cholesterol 80 0 - 99 mg/dL   Total CHOL/HDL Ratio 2    NonHDL 94.44   TSH  Result Value Ref Range   TSH 1.45 0.35 - 4.50 uIU/mL  VITAMIN D 25 Hydroxy (Vit-D Deficiency, Fractures)  Result Value Ref Range   VITD 20.47 (L) 30.00 - 100.00 ng/mL

## 2021-05-13 NOTE — Patient Instructions (Addendum)
Great to see you again today- if you have not already done so, please consider getting your 4th dose covid and shingles vaccines  I will be in touch with your labs asap  I would try an OTC steroid cream on your poison ivy as needed- you can also use some of your triamcinolone ointment  Continue to exercise on a regular basis- you are doing well    Health Maintenance, Female Adopting a healthy lifestyle and getting preventive care are important in promoting health and wellness. Ask your health care provider about:  The right schedule for you to have regular tests and exams.  Things you can do on your own to prevent diseases and keep yourself healthy. What should I know about diet, weight, and exercise? Eat a healthy diet  Eat a diet that includes plenty of vegetables, fruits, low-fat dairy products, and lean protein.  Do not eat a lot of foods that are high in solid fats, added sugars, or sodium.   Maintain a healthy weight Body mass index (BMI) is used to identify weight problems. It estimates body fat based on height and weight. Your health care provider can help determine your BMI and help you achieve or maintain a healthy weight. Get regular exercise Get regular exercise. This is one of the most important things you can do for your health. Most adults should:  Exercise for at least 150 minutes each week. The exercise should increase your heart rate and make you sweat (moderate-intensity exercise).  Do strengthening exercises at least twice a week. This is in addition to the moderate-intensity exercise.  Spend less time sitting. Even light physical activity can be beneficial. Watch cholesterol and blood lipids Have your blood tested for lipids and cholesterol at 59 years of age, then have this test every 5 years. Have your cholesterol levels checked more often if:  Your lipid or cholesterol levels are high.  You are older than 59 years of age.  You are at high risk for heart  disease. What should I know about cancer screening? Depending on your health history and family history, you may need to have cancer screening at various ages. This may include screening for:  Breast cancer.  Cervical cancer.  Colorectal cancer.  Skin cancer.  Lung cancer. What should I know about heart disease, diabetes, and high blood pressure? Blood pressure and heart disease  High blood pressure causes heart disease and increases the risk of stroke. This is more likely to develop in people who have high blood pressure readings, are of African descent, or are overweight.  Have your blood pressure checked: ? Every 3-5 years if you are 75-67 years of age. ? Every year if you are 48 years old or older. Diabetes Have regular diabetes screenings. This checks your fasting blood sugar level. Have the screening done:  Once every three years after age 63 if you are at a normal weight and have a low risk for diabetes.  More often and at a younger age if you are overweight or have a high risk for diabetes. What should I know about preventing infection? Hepatitis B If you have a higher risk for hepatitis B, you should be screened for this virus. Talk with your health care provider to find out if you are at risk for hepatitis B infection. Hepatitis C Testing is recommended for:  Everyone born from [birth year] through 1964.  Anyone with known risk factors for hepatitis C. Sexually transmitted infections (STIs)  Get screened for  STIs, including gonorrhea and chlamydia, if: ? You are sexually active and are younger than 59 years of age. ? You are older than 59 years of age and your health care provider tells you that you are at risk for this type of infection. ? Your sexual activity has changed since you were last screened, and you are at increased risk for chlamydia or gonorrhea. Ask your health care provider if you are at risk.  Ask your health care provider about whether you are at high  risk for HIV. Your health care provider may recommend a prescription medicine to help prevent HIV infection. If you choose to take medicine to prevent HIV, you should first get tested for HIV. You should then be tested every 3 months for as long as you are taking the medicine. Pregnancy  If you are about to stop having your period (premenopausal) and you may become pregnant, seek counseling before you get pregnant.  Take 400 to 800 micrograms (mcg) of folic acid every day if you become pregnant.  Ask for birth control (contraception) if you want to prevent pregnancy. Osteoporosis and menopause Osteoporosis is a disease in which the bones lose minerals and strength with aging. This can result in bone fractures. If you are 60 years old or older, or if you are at risk for osteoporosis and fractures, ask your health care provider if you should:  Be screened for bone loss.  Take a calcium or vitamin D supplement to lower your risk of fractures.  Be given hormone replacement therapy (HRT) to treat symptoms of menopause. Follow these instructions at home: Lifestyle  Do not use any products that contain nicotine or tobacco, such as cigarettes, e-cigarettes, and chewing tobacco. If you need help quitting, ask your health care provider.  Do not use street drugs.  Do not share needles.  Ask your health care provider for help if you need support or information about quitting drugs. Alcohol use  Do not drink alcohol if: ? Your health care provider tells you not to drink. ? You are pregnant, may be pregnant, or are planning to become pregnant.  If you drink alcohol: ? Limit how much you use to 0-1 drink a day. ? Limit intake if you are breastfeeding.  Be aware of how much alcohol is in your drink. In the [place] one drink equals one 12 oz bottle of beer (355 mL), one 5 oz glass of wine (148 mL), or one 1 oz glass of hard liquor (44 mL). General instructions  Schedule regular health, dental,  and eye exams.  Stay current with your vaccines.  Tell your health care provider if: ? You often feel depressed. ? You have ever been abused or do not feel safe at home. Summary  Adopting a healthy lifestyle and getting preventive care are important in promoting health and wellness.  Follow your health care provider's instructions about healthy diet, exercising, and getting tested or screened for diseases.  Follow your health care provider's instructions on monitoring your cholesterol and blood pressure. This information is not intended to replace advice given to you by your health care provider. Make sure you discuss any questions you have with your health care provider. Document Revised: 12/02/2018 Document Reviewed: 12/02/2018 Elsevier Patient Education  2021 [employer].

## 2021-05-16 ENCOUNTER — Encounter: Payer: Self-pay | Admitting: Family Medicine

## 2021-05-16 ENCOUNTER — Other Ambulatory Visit: Payer: Self-pay

## 2021-05-16 ENCOUNTER — Ambulatory Visit (INDEPENDENT_AMBULATORY_CARE_PROVIDER_SITE_OTHER): Payer: BC Managed Care – PPO | Admitting: Family Medicine

## 2021-05-16 VITALS — BP 112/64 | HR 59 | Temp 97.6°F | Resp 16 | Ht 65.0 in | Wt 117.0 lb

## 2021-05-16 DIAGNOSIS — M949 Disorder of cartilage, unspecified: Secondary | ICD-10-CM

## 2021-05-16 DIAGNOSIS — Z130 Encounter for screening for diseases of the blood and blood-forming organs and certain disorders involving the immune mechanism: Secondary | ICD-10-CM | POA: Diagnosis not present

## 2021-05-16 DIAGNOSIS — R002 Palpitations: Secondary | ICD-10-CM | POA: Diagnosis not present

## 2021-05-16 DIAGNOSIS — M899 Disorder of bone, unspecified: Secondary | ICD-10-CM | POA: Diagnosis not present

## 2021-05-16 DIAGNOSIS — Z131 Encounter for screening for diabetes mellitus: Secondary | ICD-10-CM

## 2021-05-16 DIAGNOSIS — Z1329 Encounter for screening for other suspected endocrine disorder: Secondary | ICD-10-CM | POA: Diagnosis not present

## 2021-05-16 DIAGNOSIS — Z13220 Encounter for screening for lipoid disorders: Secondary | ICD-10-CM

## 2021-05-16 DIAGNOSIS — Z0000 Encounter for general adult medical examination without abnormal findings: Secondary | ICD-10-CM

## 2021-05-16 DIAGNOSIS — R5383 Other fatigue: Secondary | ICD-10-CM | POA: Diagnosis not present

## 2021-05-16 DIAGNOSIS — Z87891 Personal history of nicotine dependence: Secondary | ICD-10-CM

## 2021-05-16 LAB — COMPREHENSIVE METABOLIC PANEL
ALT: 11 U/L (ref 0–35)
AST: 15 U/L (ref 0–37)
Albumin: 4.2 g/dL (ref 3.5–5.2)
Alkaline Phosphatase: 81 U/L (ref 39–117)
BUN: 13 mg/dL (ref 6–23)
CO2: 27 mEq/L (ref 19–32)
Calcium: 9.4 mg/dL (ref 8.4–10.5)
Chloride: 104 mEq/L (ref 96–112)
Creatinine, Ser: 0.61 mg/dL (ref 0.40–1.20)
GFR: 98.31 mL/min (ref >60.00)
Glucose, Bld: 83 mg/dL (ref 70–99)
Potassium: 4.6 mEq/L (ref 3.5–5.1)
Sodium: 141 mEq/L (ref 135–145)
Total Bilirubin: 1.4 mg/dL — ABNORMAL HIGH (ref 0.2–1.2)
Total Protein: 6.3 g/dL (ref 6.0–8.3)

## 2021-05-16 LAB — CBC
HCT: 37 % (ref 36.0–46.0)
Hemoglobin: 12.6 g/dL (ref 12.0–15.0)
MCHC: 34 g/dL (ref 30.0–36.0)
MCV: 93.4 fl (ref 78.0–100.0)
Platelets: 192 10*3/uL (ref 150.0–400.0)
RBC: 3.96 Mil/uL (ref 3.87–5.11)
RDW: 13.5 % (ref 11.5–15.5)
WBC: 4.3 10*3/uL (ref 4.0–10.5)

## 2021-05-16 LAB — LIPID PANEL
Cholesterol: 171 mg/dL (ref 0–200)
HDL: 76.9 mg/dL (ref >39.00)
LDL Cholesterol: 80 mg/dL (ref 0–99)
NonHDL: 94.44
Total CHOL/HDL Ratio: 2
Triglycerides: 71 mg/dL (ref 0.0–149.0)
VLDL: 14.2 mg/dL (ref 0.0–40.0)

## 2021-05-16 LAB — HEMOGLOBIN A1C: Hgb A1c MFr Bld: 5.4 % (ref 4.6–6.5)

## 2021-05-16 LAB — TSH: TSH: 1.45 u[IU]/mL (ref 0.35–4.50)

## 2021-05-16 LAB — VITAMIN D 25 HYDROXY (VIT D DEFICIENCY, FRACTURES): VITD: 20.47 ng/mL — ABNORMAL LOW (ref 30.00–100.00)

## 2021-05-17 ENCOUNTER — Encounter: Payer: Self-pay | Admitting: Family Medicine

## 2021-06-11 DIAGNOSIS — K219 Gastro-esophageal reflux disease without esophagitis: Secondary | ICD-10-CM | POA: Diagnosis not present

## 2021-06-11 DIAGNOSIS — Z8776 Personal history of (corrected) congenital malformations of integument, limbs and musculoskeletal system: Secondary | ICD-10-CM | POA: Diagnosis not present

## 2021-06-11 DIAGNOSIS — J3489 Other specified disorders of nose and nasal sinuses: Secondary | ICD-10-CM | POA: Diagnosis not present

## 2021-08-24 ENCOUNTER — Other Ambulatory Visit: Payer: Self-pay | Admitting: Family Medicine

## 2021-08-24 DIAGNOSIS — F411 Generalized anxiety disorder: Secondary | ICD-10-CM

## 2021-09-13 DIAGNOSIS — Z122 Encounter for screening for malignant neoplasm of respiratory organs: Secondary | ICD-10-CM | POA: Diagnosis not present

## 2021-09-13 DIAGNOSIS — Z87891 Personal history of nicotine dependence: Secondary | ICD-10-CM | POA: Diagnosis not present

## 2021-09-20 ENCOUNTER — Ambulatory Visit: Payer: BC Managed Care – PPO | Admitting: Emergency Medicine

## 2021-09-20 ENCOUNTER — Other Ambulatory Visit: Payer: Self-pay

## 2021-09-20 ENCOUNTER — Ambulatory Visit
Admission: RE | Admit: 2021-09-20 | Discharge: 2021-09-20 | Disposition: A | Discharge status: Home / Self Care | Payer: Self-pay | Source: Ambulatory Visit | Attending: Emergency Medicine | Admitting: Emergency Medicine

## 2021-09-20 ENCOUNTER — Encounter: Payer: Self-pay | Admitting: Emergency Medicine

## 2021-09-20 VITALS — BP 88/60 | HR 58 | Temp 98.0°F | Ht 66.0 in | Wt 118.0 lb

## 2021-09-20 DIAGNOSIS — F17200 Nicotine dependence, unspecified, uncomplicated: Secondary | ICD-10-CM

## 2021-09-20 DIAGNOSIS — J449 Chronic obstructive pulmonary disease, unspecified: Secondary | ICD-10-CM

## 2021-09-20 NOTE — Assessment & Plan Note (Signed)
Successfully quit completely.  RADS 2 lung cancer screening CT from this month.  Needs a repeat in 1 year.  We will order through [facility] and then follow-up here

## 2021-09-20 NOTE — Progress Notes (Signed)
Subjective:    Patient ID: Michele Jacobs, female    DOB: Jan 17, 1961, 60 y.o.   MRN: [id number]  HPI 60 year old former smoker (15 - 20 pk-yrs) with history of anxiety, GERD with LPR, MVP. She is referred today for evaluation of abnormal CT scan of the chest.  She does not cough, no sputum. Can sometimes get SOB at rest or w exertion. She has albuterol that she uses once a week. She has been treated for a possible AE-COPD before in 2019.   She has participated in the lung cancer screening program, most recent scan in our system was 05/21/2019 which I reviewed, showed several small stable pulmonary nodules largest in the right lower lobe superior segment 7 mm.  Repeat CT chest on 06/01/2020 at [facility] showed a semisolid opacity in the posterior right lower lobe, now 2.2 x 1.9 x 3.8 cm.  There is no lymphadenopathy. I have the report but not the images.   ROV 09/07/20 --60 year old woman, former smoker (15 pack years) with anxiety, MVP, GERD with LPR.  She used to be in our lung cancer screening program, most recently has had her CT scans of the chest at [facility].  We repeated a CT chest on 08/25/2020 to follow a 2.2 x 1.9 posterior right lower lobe opacity. She is using taking albuterol about 3x a week. Seems to help her some. No functional limitation.   CT chest 08/25/2020 reviewed by me, shows resolution of her previously scribe right posterior costophrenic angle opacity.  She has bilateral apical pleural scarring, no new infiltrates or concerning nodules.  She should not need serial follow-up  ROV 09/20/21 --follow-up visit for 60 year old woman, with history of 15 pack years tobacco use, MVP, GERD, anxiety.  We had been following posterior right lower lobe opacity on serial CT scans of the chest, was resolved on her scan last year.  She returns today after getting back to the lung cancer screening program. Related mild COPD based on symptoms.  She has albuterol which she uses approximately . She can  have some SOB with very hot weather and exertion. Rare albuterol use. No cough. No flares since last visit. She did have an AE back in 2019.   CT chest from [facility] dated 09/13/2021 and reviewed by me, shows no infiltrates or abnormalities.  Low risk study.   Review of Systems As per HPI  Past Medical History:  Diagnosis Date   Anxiety    Anxiety state 04/02/2013   Overview:  STORY: See history dated 01/19/2010`E1o3L`IMPRESSION: Continue medications as prescribed.  Relaxation techniques described.  Using Xanax prn.  May call for refills.  Using xanax 1/2 tablet qHS prn.  Last Assessment & Plan:  Worsening since her father became terminally ill. History of the same with similar presentation. 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  Repeat 2014`E1o3L`IMPRESSION: Continue calcium 1200mg  daily with vitamin D.   Ear pain, bilateral 08/22/2014   Last Assessment & Plan:  May be related to eustachian tube dysfunction. Advised antihistamine-decongestant and Flonase Daily for the next 10-14 days. If symptoms do not improve, or if they worsen, notify me.   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 Labs ordered. Advised trial of magnesium supplement, 400-800 milligrams nightly over the next few weeks.   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  The goal for anyone who smokes is to decrease the amount smoked each day and then eventually quit.  Stop smoking groups, printed information and medications are all available for your use when you decide to stop. She ONLY smokes  if she has had quite a few drinks. jmw     Family History  Problem Relation Age of Onset   Ovarian cancer Mother    Cancer Mother    Lymphoma Father    Heart disease Father    Cancer Father    Non-Hodgkin's lymphoma Father    Atrial fibrillation Father    Obesity Sister      Social History   Socioeconomic History   Marital status: Single    Spouse name: Not on file   Number of children: Not on file   Years of education: Not on file   Highest education level: Not on file  Occupational History   Occupation: bookkeeper  Tobacco Use   Smoking status: Former    Packs/day: 0.50    Years: 30.00    Pack years: 15.00    Types: Cigarettes    Quit date: 2013    Years since quitting: 9.7   Smokeless tobacco: Never  Vaping Use   Vaping Use: Never used  Substance and Sexual Activity   Alcohol use: No   Drug use: No   Sexual activity: Never    Birth control/protection: Surgical  Other Topics Concern   Not on file  Social History Narrative   Lives alone;   No children   Bookkeeper   Social Determinants of Corporate investment banker Strain: Not on file  Food Insecurity: Not on file  Transportation Needs: Not on file  Physical Activity: Not on file  Stress: Not on file  Social Connections: Not on file  Intimate Partner Violence: Not on file    [place], office admin. No inhaled exposures.   Allergies  Allergen Reactions   Codeine Nausea And Vomiting     Outpatient Medications Prior to Visit  Medication Sig Dispense Refill   albuterol (VENTOLIN HFA) 108 (90 Base) MCG/ACT inhaler Inhale 2 puffs into the lungs every 6 (six) hours as needed for wheezing or shortness of breath. 18 g 6   ALPRAZolam (XANAX) 0.25 MG tablet TAKE ONE TABLET BY MOUTH DAILY AS NEEDED FOR ANXIETY 30 tablet 0   aspirin EC 81 MG tablet Take 1 tablet (81 mg total) by mouth daily. 90 tablet 3   cholecalciferol (VITAMIN D) 1000 units tablet Take 1,000 Units by mouth daily.      erythromycin with ethanol (EMGEL) 2 % gel Apply topically daily. 30 g 1   TOPROL XL 25 MG 24 hr tablet Take 1 tablet (25 mg total) by mouth daily. 90 tablet 1   triamcinolone ointment (KENALOG) 0.1 % Apply 1 application topically 2 (two) times daily. Use on eyelids for no more than 6 days at a time 15 g 0   No facility-administered medications prior to visit.         Objective:   Physical Exam Vitals:   09/20/21 0857  BP: (!) 88/60  Pulse: (!) 58  Temp: 98 F (36.7 C)  TempSrc:  Oral  SpO2: 96%  Weight: 118 lb (53.5 kg)  Height: 5\' 6"  (1.676 m)   Gen: Pleasant, well-nourished, in no distress,  normal affect  ENT: No lesions,  mouth clear,  oropharynx clear, no postnasal drip  Neck: No JVD, no stridor  Lungs: No use of accessory muscles, no crackles or wheezing on normal respiration, no wheeze on forced expiration  Cardiovascular: RRR, heart sounds normal, no murmur or gallops, no peripheral edema  Musculoskeletal: No deformities, no cyanosis or clubbing  Neuro: alert, awake, non focal  Skin: Warm, no lesions or rash      Assessment & Plan:  Tobacco use disorder Successfully quit completely.  RADS 2 lung cancer screening CT from this month.  Needs a repeat in 1 year.  We will order through [facility] and then follow-up here  COPD, mild (HCC) Mild COPD.  She has never had pulmonary function testing.  Minimal symptoms and minimal albuterol use.  We can talk about performing PFT in the future if her symptoms progress  , MD, PhD 09/20/2021, 9:21 AM [facility] [phone] or if no answer [phone]

## 2021-09-20 NOTE — Patient Instructions (Signed)
We will plan to repeat your lung cancer screening CT scan of the chest in September 2023 Keep your albuterol available use 2 puffs if needed for shortness breath, chest tightness, wheezing. Call our office for any new respiratory problems or symptoms. Follow Dr. Delton Coombes in 1 year to review your CT chest or sooner if you have any problems.

## 2021-09-20 NOTE — Assessment & Plan Note (Signed)
Mild COPD.  She has never had pulmonary function testing.  Minimal symptoms and minimal albuterol use.  We can talk about performing PFT in the future if her symptoms progress

## 2021-10-09 DIAGNOSIS — Z23 Encounter for immunization: Secondary | ICD-10-CM | POA: Diagnosis not present

## 2021-11-09 ENCOUNTER — Encounter: Payer: Self-pay | Admitting: Family Medicine

## 2021-11-09 ENCOUNTER — Other Ambulatory Visit: Payer: Self-pay | Admitting: Family Medicine

## 2021-11-09 DIAGNOSIS — R002 Palpitations: Secondary | ICD-10-CM

## 2021-11-10 MED ORDER — TOPROL XL 25 MG PO TB24: 25.0000 mg | ORAL_TABLET | Freq: Every day | ORAL | 1 refills | Status: DC

## 2021-11-28 DIAGNOSIS — Z1231 Encounter for screening mammogram for malignant neoplasm of breast: Secondary | ICD-10-CM | POA: Diagnosis not present

## 2021-11-28 DIAGNOSIS — Z681 Body mass index (BMI) 19 or less, adult: Secondary | ICD-10-CM | POA: Diagnosis not present

## 2021-11-28 DIAGNOSIS — Z13820 Encounter for screening for osteoporosis: Secondary | ICD-10-CM | POA: Diagnosis not present

## 2021-11-28 DIAGNOSIS — Z01419 Encounter for gynecological examination (general) (routine) without abnormal findings: Secondary | ICD-10-CM | POA: Diagnosis not present

## 2021-11-30 ENCOUNTER — Other Ambulatory Visit: Payer: Self-pay | Admitting: Family Medicine

## 2021-11-30 DIAGNOSIS — R0602 Shortness of breath: Secondary | ICD-10-CM

## 2021-12-20 ENCOUNTER — Encounter: Payer: Self-pay | Admitting: Family Medicine

## 2021-12-20 DIAGNOSIS — U071 COVID-19: Secondary | ICD-10-CM

## 2021-12-20 MED ORDER — MOLNUPIRAVIR EUA 200MG CAPSULE: 4.0000 | ORAL_CAPSULE | Freq: Two times a day (BID) | ORAL | 0 refills | Status: AC

## 2021-12-20 NOTE — Telephone Encounter (Signed)
Dr. Patsy Lager sent in medication

## 2022-02-12 ENCOUNTER — Other Ambulatory Visit: Payer: Self-pay | Admitting: Family Medicine

## 2022-02-12 DIAGNOSIS — F411 Generalized anxiety disorder: Secondary | ICD-10-CM

## 2022-02-12 MED ORDER — ALPRAZOLAM 0.25 MG PO TABS: ORAL_TABLET | ORAL | 0 refills | Status: DC

## 2022-04-06 NOTE — Progress Notes (Deleted)
[facility] at [facility] [address], [place], [place] [address] [phone]  Date:  04/10/2022   Name:  Michele Jacobs   DOB:  1961/08/05   MRN:  [id number]  PCP:  Darreld Mclean, MD    Chief Complaint: No chief complaint on file.   History of Present Illness:  Michele Jacobs is a 61 y.o. 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  Relaxation techniques described.  Using Xanax prn.  May call for refills.  Using xanax 1/2 tablet qHS prn.  Last Assessment & Plan:  Worsening since her father became terminally ill. History of the same with similar presentation. 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  Repeat 2014`E1o3L`IMPRESSION: Continue calcium 1200mg  daily with vitamin D.   Ear pain, bilateral 08/22/2014   Last Assessment & Plan:  May be related to eustachian tube dysfunction. Advised antihistamine-decongestant and Flonase Daily for the next 10-14 days. If symptoms do not improve, or if they worsen, notify me.   Increased frequency of urination 08/12/2012   Laryngopharyngeal reflux (LPR) 07/15/2017   Mitral valve prolapse    Muscle cramps 08/22/2014   Last Assessment & Plan:  Relative dehydration possibly. Labs ordered. Advised trial of magnesium supplement, 400-800 milligrams nightly over the next few weeks.   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  The goal for anyone who smokes is to decrease the amount smoked each day and then eventually quit.  Stop smoking groups, printed information and medications are all available for your use when you decide to stop. She ONLY smokes if she has had quite a few drinks. jmw    Past Surgical History:  Procedure Laterality Date   THYROGLOSSAL DUCT CYST  01/2018   removal   TUBAL LIGATION      Social History   Tobacco Use   Smoking status: Former    Packs/day: 0.50    Years:  30.00    Pack years: 15.00    Types: Cigarettes    Quit date: 2013    Years since quitting: 10.2   Smokeless tobacco: Never  Vaping Use   Vaping Use: Never used  Substance Use Topics   Alcohol use: No   Drug use: No    Family History  Problem Relation Age of Onset   Ovarian cancer Mother    Cancer Mother    Lymphoma Father    Heart disease Father    Cancer Father    Non-Hodgkin's lymphoma Father    Atrial fibrillation Father    Obesity Sister     Allergies  Allergen Reactions   Codeine Nausea And Vomiting    Medication list has been reviewed and updated.  Current  Outpatient Medications on File Prior to Visit  Medication Sig Dispense Refill   ALPRAZolam (XANAX) 0.25 MG tablet TAKE ONE TABLET BY MOUTH DAILY AS NEEDED FOR ANXIETY Strength: 0.25 mg 30 tablet 0   aspirin EC 81 MG tablet Take 1 tablet (81 mg total) by mouth daily. 90 tablet 3   cholecalciferol (VITAMIN D) 1000 units tablet Take 1,000 Units by mouth daily.     erythromycin with ethanol (EMGEL) 2 % gel Apply topically daily. 30 g 1   TOPROL XL 25 MG 24 hr tablet Take 1 tablet (25 mg total) by mouth daily. 90 tablet 1   triamcinolone ointment (KENALOG) 0.1 % Apply 1 application topically 2 (two) times daily. Use on eyelids for no more than 6 days at a time 15 g 0   VENTOLIN HFA 108 (90 Base) MCG/ACT inhaler INHALE 2 PUFFS INTO THE LUNGS EVERY 6 HOURS AS NEEDED FOR WHEEZING OR FOR SHORTNESS OF BREATH 18 g 6   No current facility-administered medications on file prior to visit.    Review of Systems:  As per HPI- otherwise negative.   Physical Examination: There were no vitals filed for this visit. There were no vitals filed for this visit. There is no height or weight on file to calculate BMI. Ideal Body Weight:    GEN: no acute distress. HEENT: Atraumatic, Normocephalic.  Ears and Nose: No external deformity. CV: RRR, No M/G/R. No JVD. No thrill. No extra heart sounds. PULM: CTA B, no wheezes, crackles, rhonchi. No retractions. No resp. distress. No accessory muscle use. ABD: S, NT, ND, +BS. No rebound. No HSM. EXTR: No c/c/e PSYCH: Normally interactive. Conversant.    Assessment and Plan: ***  Signed Lamar Blinks, MD

## 2022-04-08 NOTE — Progress Notes (Signed)
?Cardiology Office Note:   ? ?Date:  04/09/2022  ? ?ID:  Michele Jacobs, DOB 01/02/1961, MRN [id number] ? ?PCP:  Pearline Cables, MD  ?Cardiologist:  Norman Herrlich, MD   ? ?Referring MD: Pearline Cables, MD  ? ? ?ASSESSMENT:   ? ?1. Mitral valve prolapse   ?2. Family history of atrial fibrillation   ? ?PLAN:   ? ?In order of problems listed above: ? ?Doing very well palpitation arrhythmia controlled continue her beta-blocker no click or murmur not having symptoms I do not think she requires repeat cardiac imaging ?I have asked her to home self monitor with the device is available lysing her smart phone for atrial fibrillation detection ? ? ?Next appointment: 1 year ? ? ?Medication Adjustments/Labs and Tests Ordered: ?Current medicines are reviewed at length with the patient today.  Concerns regarding medicines are outlined above.  ?No orders of the defined types were placed in this encounter. ? ?No orders of the defined types were placed in this encounter. ? ? ?Chief Complaint  ?Patient presents with  ? Follow-up  ?  With mitral valve prolapse and palpitation ?  ? ? ?History of Present Illness:   ? ?Michele Jacobs is a 61 y.o. female with a hx of MVP last seen 06/01/2019.She has a history of palpitations, mitral valve prolapse with minimal mitral regurgitation, and has experienced episodic palpitations. In 2009 she had developed some episodes of chest discomfort with atypical features. A Myoview scan was negative for ischemia. Her last echo Doppler study was in July 2013 which showed normal LV size and function. There was borderline mitral valve prolapse with trace MR. Mild TR. She had normal pulmonary pressures.   ? ?Compliance with diet, lifestyle and medications: Yes ? ?Her life is now caring for her 60 year old father ?She continues to do well with her beta-blocker is not having palpitations syncope chest pain edema or shortness of breath ?She has a strong family history of atrial fibrillation and seeks  advice ?I told her she could get a smart watch either parent apple 1 with her phone or a Fitbit for detection or she can screen her self at home with the mobile kardia device ?Past Medical History:  ?Diagnosis Date  ? Anxiety   ? Anxiety state 04/02/2013  ? Overview:  STORY: See history dated 01/19/2010`E1o3L`IMPRESSION: Continue medications as prescribed.  Relaxation techniques described.  Using Xanax prn.  May call for refills.  Using xanax 1/2 tablet qHS prn.  Last Assessment & Plan:  Worsening since her father became terminally ill. History of the same with similar presentation. She does not want to go back on Paxil which may have helped in the pa  ? 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 Overview:  STORY: Last Dexa 2011.  Repeat 2014`E1o3L`IMPRESSION: Continue calcium 1200mg  daily with vitamin D.  ? Ear pain, bilateral 08/22/2014  ? Last Assessment & Plan:  May be related to eustachian tube dysfunction. Advised antihistamine-decongestant and Flonase Daily for the next 10-14 days. If symptoms do not improve, or if they worsen, notify me.  ? Increased frequency of urination 08/12/2012  ? Laryngopharyngeal reflux (LPR) 07/15/2017  ? Mitral valve prolapse   ? Muscle cramps 08/22/2014  ? Last Assessment & Plan:  Relative dehydration possibly. Labs ordered. Advised trial of magnesium supplement, 400-800 milligrams nightly over the next few weeks.  ? Osteopenia 04/24/2015  ? Per dexa 03/2015, 10/18  ? Palpitations 01/20/2014  ?  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 Tobacco use disorder 04/02/2013  ? Overview:  IMPRESSION: Actively trying to quit.  The goal for anyone who smokes is to decrease the amount smoked each day and then eventually quit.  Stop smoking groups, printed information and medications are all available for your use when you decide to stop. She ONLY smokes if she has had quite a few drinks. jmw  ? ? ?Past Surgical History:  ?Procedure Laterality Date  ? THYROGLOSSAL DUCT CYST   01/2018  ? removal  ? TUBAL LIGATION    ? ? ?Current Medications: ?Current Meds  ?Medication Sig  ? ALPRAZolam (XANAX) 0.25 MG tablet TAKE ONE TABLET BY MOUTH DAILY AS NEEDED FOR ANXIETY Strength: 0.25 mg  ? aspirin EC 81 MG tablet Take 1 tablet (81 mg total) by mouth daily.  ? cholecalciferol (VITAMIN D) 1000 units tablet Take 1,000 Units by mouth daily.  ? TOPROL XL 25 MG 24 hr tablet Take 1 tablet (25 mg total) by mouth daily.  ? VENTOLIN HFA 108 (90 Base) MCG/ACT inhaler INHALE 2 PUFFS INTO THE LUNGS EVERY 6 HOURS AS NEEDED FOR WHEEZING OR FOR SHORTNESS OF BREATH  ?  ? ?Allergies:   Codeine  ? ?Social History  ? ?Socioeconomic History  ? Marital status: Single  ?  Spouse name: Not on file  ? Number of children: Not on file  ? Years of education: Not on file  ? Highest education level: Not on file  ?Occupational History  ? Occupation: bookkeeper  ?Tobacco Use  ? Smoking status: Former  ?  Packs/day: 0.50  ?  Years: 30.00  ?  Pack years: 15.00  ?  Types: Cigarettes  ?  Quit date: 2013  ?  Years since quitting: 10.2  ? Smokeless tobacco: Never  ?Vaping Use  ? Vaping Use: Never used  ?Substance and Sexual Activity  ? Alcohol use: No  ? Drug use: No  ? Sexual activity: Never  ?  Birth control/protection: Surgical  ?Other Topics Concern  ? Not on file  ?Social History Narrative  ? Lives alone;  ? No children  ? Bookkeeper  ? ?Social Determinants of Health  ? ?Financial Resource Strain: Not on file  ?Food Insecurity: Not on file  ?Transportation Needs: Not on file  ?Physical Activity: Not on file  ?Stress: Not on file  ?Social Connections: Not on file  ?  ? ?Family History: ?The patient's family history includes Atrial fibrillation in her father; Cancer in her father and mother; Heart disease in her father; Lymphoma in her father; Non-Hodgkin's lymphoma in her father; Obesity in her sister; Ovarian cancer in her mother. ?ROS:   ?Please see the history of present illness.    ?All other systems reviewed and are  negative. ? ?EKGs/Labs/Other Studies Reviewed:   ? ?The following studies were reviewed today: ? ?EKG:  EKG ordered today and personally reviewed.  The ekg ordered today demonstrates sinus rhythm normal EKG there was 1 atrial premature contraction. ? ?Recent Labs: ?05/16/2021: ALT 11; BUN 13; Creatinine, Ser 0.61; Hemoglobin 12.6; Platelets 192.0; Potassium 4.6; Sodium 141; TSH 1.45  ?Recent Lipid Panel ?   ?Component Value Date/Time  ? CHOL 171 05/16/2021 0839  ? TRIG 71.0 05/16/2021 0839  ? HDL 76.90 05/16/2021 0839  ? CHOLHDL 2 05/16/2021 0839  ? VLDL 14.2 05/16/2021 0839  ? LDLCALC 80 05/16/2021 0839  ? ? ?Physical Exam:   ? ?VS:  BP 110/60   Pulse (!) 58  Ht 5\' 6"  (1.676 m)   Wt 115 lb (52.2 kg)   SpO2 98%   BMI 18.56 kg/m?    ? ?Wt Readings from Last 3 Encounters:  ?04/09/22 115 lb (52.2 kg)  ?09/20/21 118 lb (53.5 kg)  ?05/16/21 117 lb (53.1 kg)  ?  ? ?GEN:  Well nourished, well developed in no acute distress ?HEENT: Normal ?NECK: No JVD; No carotid bruits ?LYMPHATICS: No lymphadenopathy ?CARDIAC: No click or murmur RRR, no murmurs, rubs, gallops ?RESPIRATORY:  Clear to auscultation without rales, wheezing or rhonchi  ?ABDOMEN: Soft, non-tender, non-distended ?MUSCULOSKELETAL:  No edema; No deformity  ?SKIN: Warm and dry ?NEUROLOGIC:  Alert and oriented x 3 ?PSYCHIATRIC:  Normal affect  ? ? ?Signed, ?Norman HerrlichBrian Dellar Traber, MD  ?04/09/2022 4:37 PM    ?[facility]  ?

## 2022-04-09 ENCOUNTER — Ambulatory Visit: Payer: BC Managed Care – PPO | Admitting: Cardiology

## 2022-04-09 ENCOUNTER — Encounter: Payer: Self-pay | Admitting: Cardiology

## 2022-04-09 VITALS — BP 110/60 | HR 58 | Ht 66.0 in | Wt 115.0 lb

## 2022-04-09 DIAGNOSIS — Z8249 Family history of ischemic heart disease and other diseases of the circulatory system: Secondary | ICD-10-CM | POA: Diagnosis not present

## 2022-04-09 DIAGNOSIS — I341 Nonrheumatic mitral (valve) prolapse: Secondary | ICD-10-CM | POA: Diagnosis not present

## 2022-04-09 NOTE — Patient Instructions (Signed)
Medication Instructions:  ?Your physician recommends that you continue on your current medications as directed. Please refer to the Current Medication list given to you today. ? ?*If you need a refill on your cardiac medications before your next appointment, please call your pharmacy* ? ? ?Lab Work: ?None ?If you have labs (blood work) drawn today and your tests are completely normal, you will receive your results only by: ?MyChart Message (if you have MyChart) OR ?A paper copy in the mail ?If you have any lab test that is abnormal or we need to change your treatment, we will call you to review the results. ? ? ?Testing/Procedures: ?None ? ? ?Follow-Up: ?At [facility], you and your health needs are our priority.  As part of our continuing mission to provide you with exceptional heart care, we have created designated Provider Care Teams.  These Care Teams include your primary Cardiologist (physician) and Advanced Practice Providers (APPs -  Physician Assistants and Nurse Practitioners) who all work together to provide you with the care you need, when you need it. ? ?We recommend signing up for the patient portal called "MyChart".  Sign up information is provided on this After Visit Summary.  MyChart is used to connect with patients for Virtual Visits (Telemedicine).  Patients are able to view lab/test results, encounter notes, upcoming appointments, etc.  Non-urgent messages can be sent to your provider as well.   ?To learn more about what you can do with MyChart, go to [email or web address].   ? ?Your next appointment:   ?1 year(s) ? ?The format for your next appointment:   ?In Person ? ?Provider:   ?Norman Herrlich, MD  ? ? ?Other Instructions ?None ? ?Important Information About Sugar ? ? ? ? ?  ? ? ? ?KardiaMobile [email or web address] ? ? ? ? ? ? ? ?FDA-cleared, clinical grade mobile EKG monitor: Lourena Simmonds is the most clinically-validated mobile EKG used by the world's leading cardiac  care medical professionals With Basic service, know instantly if your heart rhythm is normal or if atrial fibrillation is detected, and email the last single EKG recording to yourself or your doctor Premium service, available for purchase through the Kardia app for $9.99 per month or $99 per year, includes unlimited history and storage of your EKG recordings, a monthly EKG summary report to share with your doctor, along with the ability to track your blood pressure, activity and weight Includes one KardiaMobile phone clip FREE SHIPPING: Standard delivery 1-3 business days. Orders placed by 11:00am PST will ship that afternoon. Otherwise, will ship next business day. All orders ship via [employer] from [place], [place] ? ? ? Or use an Apple or Fit Bit smart watch to detect atrial fibrillation ?

## 2022-04-10 ENCOUNTER — Ambulatory Visit: Payer: BC Managed Care – PPO | Admitting: Family Medicine

## 2022-04-10 DIAGNOSIS — R17 Unspecified jaundice: Secondary | ICD-10-CM

## 2022-04-10 DIAGNOSIS — Z130 Encounter for screening for diseases of the blood and blood-forming organs and certain disorders involving the immune mechanism: Secondary | ICD-10-CM

## 2022-04-10 DIAGNOSIS — E559 Vitamin D deficiency, unspecified: Secondary | ICD-10-CM

## 2022-04-10 DIAGNOSIS — Z131 Encounter for screening for diabetes mellitus: Secondary | ICD-10-CM

## 2022-04-11 DIAGNOSIS — R208 Other disturbances of skin sensation: Secondary | ICD-10-CM | POA: Diagnosis not present

## 2022-04-11 DIAGNOSIS — Z789 Other specified health status: Secondary | ICD-10-CM | POA: Diagnosis not present

## 2022-04-11 DIAGNOSIS — L538 Other specified erythematous conditions: Secondary | ICD-10-CM | POA: Diagnosis not present

## 2022-04-11 DIAGNOSIS — L814 Other melanin hyperpigmentation: Secondary | ICD-10-CM | POA: Diagnosis not present

## 2022-04-11 DIAGNOSIS — L820 Inflamed seborrheic keratosis: Secondary | ICD-10-CM | POA: Diagnosis not present

## 2022-05-05 ENCOUNTER — Other Ambulatory Visit: Payer: Self-pay | Admitting: Family Medicine

## 2022-05-05 DIAGNOSIS — R002 Palpitations: Secondary | ICD-10-CM

## 2022-05-12 NOTE — Patient Instructions (Addendum)
Good to see you today- I will be in touch with your labs asap  I would suggest getting the shingles vaccine and covid booster at your convenience   Please see me in one year for your physical

## 2022-05-12 NOTE — Progress Notes (Addendum)
[facility] [address], [address] [place], [place] [address] [phone]  Date:  05/22/2022   Name:  Michele Jacobs   DOB:  06/25/1961   MRN:  [id number]  PCP:  Pearline Cables, MD    Chief Complaint: Annual Exam (Concerns/ questions: none/Shingrix: none/Mammogram: yes)   History of Present Illness:  Michele Jacobs is a 61 y.o. very pleasant female patient who presents with the following:  Pt seen today for a CPE- History of osteopenia, MVP, reflux, anxiety, palpitations treated with toprol xl Last seen by myself about one year ago  She had covid in January of this year- pt notes she has recovered well but her memory seems to be not as good since she was sick  She was seen by cardiology last month- Dr Dulce Sellar  Doing very well palpitation arrhythmia controlled continue her beta-blocker no click or murmur not having symptoms I do not think she requires repeat cardiac imaging I have asked her to home self monitor with the device is available lysing her smart phone for atrial fibrillation detection  Her pulmonoloigist is Dr Rosezetta Schlatter- COPD- she will see him this fall  Lung cancer screening is UTD- 9/22 Shingrix- pt declines  Covid booster- recommended  Mammo- UTD Cologuard can be ordered- due in May  She would like to do a colonscopy this time, placed referal  Dexa can be updated- done 11/28/21- osteopenia.  GYN would like a vit D and TSH level which we can do today  Labs due for update  Pap- per GYN No CP or SOB with exercise She gets regular exercise   Her father wrote a book about his life, which is on [place].  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  Relaxation techniques described.  Using Xanax prn.  May call for refills.  Using xanax 1/2 tablet qHS prn.  Last Assessment & Plan:  Worsening since her father became terminally ill. History of the same with similar presentation. 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  Repeat 2014`E1o3L`IMPRESSION: Continue calcium 1200mg  daily with vitamin D.   Ear pain, bilateral 08/22/2014   Last Assessment & Plan:  May be related to eustachian tube dysfunction. Advised antihistamine-decongestant and Flonase Daily for the next 10-14 days. If symptoms do not improve, or if they worsen, notify me.   Increased frequency of urination 08/12/2012   Laryngopharyngeal reflux (LPR) 07/15/2017   Mitral valve prolapse  Muscle cramps 08/22/2014   Last Assessment & Plan:  Relative dehydration possibly. Labs ordered. Advised trial of magnesium supplement, 400-800 milligrams nightly over the next few weeks.   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  The goal for anyone who smokes is to decrease the amount smoked each day and then eventually quit.  Stop smoking groups, printed information and medications are all  available for your use when you decide to stop. She ONLY smokes if she has had quite a few drinks. jmw    Past Surgical History:  Procedure Laterality Date   THYROGLOSSAL DUCT CYST  01/2018   removal   TUBAL LIGATION      Social History   Tobacco Use   Smoking status: Former    Packs/day: 0.50    Years: 30.00    Pack years: 15.00    Types: Cigarettes    Quit date: 2013    Years since quitting: 10.4   Smokeless tobacco: Never  Vaping Use   Vaping Use: Never used  Substance Use Topics   Alcohol use: No   Drug use: No    Family History  Problem Relation Age of Onset   Ovarian cancer Mother    Cancer Mother    Lymphoma Father    Heart disease Father    Cancer Father    Non-Hodgkin's lymphoma Father    Atrial fibrillation Father    Obesity Sister     Allergies  Allergen Reactions   Codeine Nausea And Vomiting    Medication list has been reviewed and updated.  Current Outpatient Medications on File Prior to Visit  Medication Sig Dispense Refill   ALPRAZolam (XANAX) 0.25 MG tablet TAKE ONE TABLET BY MOUTH DAILY AS NEEDED FOR ANXIETY Strength: 0.25 mg 30 tablet 0   aspirin EC 81 MG tablet Take 1 tablet (81 mg total) by mouth daily. 90 tablet 3   cholecalciferol (VITAMIN D) 1000 units tablet Take 1,000 Units by mouth daily.     TOPROL XL 25 MG 24 hr tablet TAKE ONE TABLET BY MOUTH DAILY 90 tablet 1   VENTOLIN HFA 108 (90 Base) MCG/ACT inhaler INHALE 2 PUFFS INTO THE LUNGS EVERY 6 HOURS AS NEEDED FOR WHEEZING OR FOR SHORTNESS OF BREATH 18 g 6   No current facility-administered medications on file prior to visit.    Review of Systems:  As per HPI- otherwise negative.  Wt Readings from Last 3 Encounters:  05/22/22 118 lb 3.2 oz (53.6 kg)  04/09/22 115 lb (52.2 kg)  09/20/21 118 lb (53.5 kg)    Physical Examination: Vitals:   05/22/22 0817  BP: 110/80  Pulse: (!) 46  Resp: 19  Temp: 98.2 F (36.8 C)  SpO2: 97%   Vitals:   05/22/22 0817  Weight: 118  lb 3.2 oz (53.6 kg)  Height: 5\' 6"  (1.676 m)   Body mass index is 19.08 kg/m. Ideal Body Weight: Weight in (lb) to have BMI = 25: 154.6  GEN: no acute distress.  Slim build, looks well  HEENT: Atraumatic, Normocephalic.  Bilateral TM wnl, oropharynx normal.  PEERL,EOMI.   Ears and Nose: No external deformity. CV: RRR, No M/G/R. No JVD. No thrill. No extra heart sounds. PULM: CTA B, no wheezes, crackles, rhonchi. No retractions. No resp. distress. No accessory muscle use. ABD: S, NT, ND, +BS. No rebound.  No HSM. EXTR: No c/c/e PSYCH: Normally interactive. Conversant.   Pulse Readings from Last 3 Encounters:  05/22/22 (!) 46  04/09/22 (!) 58  09/20/21 (!) 58    Assessment and Plan: Physical exam  Screening for hyperlipidemia - Plan: Lipid panel  Heart palpitations  Screening for diabetes mellitus - Plan: Comprehensive metabolic panel, Hemoglobin A1c  Screening for deficiency anemia - Plan: CBC  Screening for thyroid disorder - Plan: TSH  Fatigue, unspecified type - Plan: TSH  Elevated bilirubin - Plan: Bilirubin,Direct/Indirect(Fractionated)  Osteopenia, unspecified location - Plan: VITAMIN D 25 Hydroxy (Vit-D Deficiency, Fractures)  Screening for colon cancer - Plan: Ambulatory referral to Gastroenterology  Physical exam- encouraged healthy diet and exercise routine Will plan further follow- up pending labs. With labs- offer AAA screening   Signed Lamar Blinks, MD  Received labs as below, message to patient Results for orders placed or performed in visit on 05/22/22  CBC  Result Value Ref Range   WBC 4.4 4.0 - 10.5 K/uL   RBC 4.02 3.87 - 5.11 Mil/uL   Platelets 198.0 150.0 - 400.0 K/uL   Hemoglobin 12.8 12.0 - 15.0 g/dL   HCT 38.1 36.0 - 46.0 %   MCV 94.6 78.0 - 100.0 fl   MCHC 33.6 30.0 - 36.0 g/dL   RDW 13.3 11.5 - 15.5 %  Comprehensive metabolic panel  Result Value Ref Range   Sodium 140 135 - 145 mEq/L   Potassium 4.6 3.5 - 5.1 mEq/L   Chloride  104 96 - 112 mEq/L   CO2 29 19 - 32 mEq/L   Glucose, Bld 78 70 - 99 mg/dL   BUN 14 6 - 23 mg/dL   Creatinine, Ser 0.70 0.40 - 1.20 mg/dL   Total Bilirubin 1.4 (H) 0.2 - 1.2 mg/dL   Alkaline Phosphatase 76 39 - 117 U/L   AST 16 0 - 37 U/L   ALT 14 0 - 35 U/L   Total Protein 6.5 6.0 - 8.3 g/dL   Albumin 4.2 3.5 - 5.2 g/dL   GFR 94.43 >60.00 mL/min   Calcium 9.5 8.4 - 10.5 mg/dL  Hemoglobin A1c  Result Value Ref Range   Hgb A1c MFr Bld 5.4 4.6 - 6.5 %  Lipid panel  Result Value Ref Range   Cholesterol 183 0 - 200 mg/dL   Triglycerides 67.0 0.0 - 149.0 mg/dL   HDL 85.90 >39.00 mg/dL   VLDL 13.4 0.0 - 40.0 mg/dL   LDL Cholesterol 84 0 - 99 mg/dL   Total CHOL/HDL Ratio 2    NonHDL 97.18   TSH  Result Value Ref Range   TSH 1.37 0.35 - 5.50 uIU/mL  VITAMIN D 25 Hydroxy (Vit-D Deficiency, Fractures)  Result Value Ref Range   VITD 35.99 30.00 - 100.00 ng/mL

## 2022-05-22 ENCOUNTER — Ambulatory Visit (INDEPENDENT_AMBULATORY_CARE_PROVIDER_SITE_OTHER): Payer: BC Managed Care – PPO | Admitting: Family Medicine

## 2022-05-22 ENCOUNTER — Encounter: Payer: Self-pay | Admitting: Family Medicine

## 2022-05-22 VITALS — BP 110/80 | HR 55 | Temp 98.2°F | Resp 19 | Ht 66.0 in | Wt 118.2 lb

## 2022-05-22 DIAGNOSIS — R5383 Other fatigue: Secondary | ICD-10-CM

## 2022-05-22 DIAGNOSIS — Z130 Encounter for screening for diseases of the blood and blood-forming organs and certain disorders involving the immune mechanism: Secondary | ICD-10-CM | POA: Diagnosis not present

## 2022-05-22 DIAGNOSIS — R002 Palpitations: Secondary | ICD-10-CM | POA: Diagnosis not present

## 2022-05-22 DIAGNOSIS — M8580 Other specified disorders of bone density and structure, unspecified site: Secondary | ICD-10-CM | POA: Diagnosis not present

## 2022-05-22 DIAGNOSIS — Z13220 Encounter for screening for lipoid disorders: Secondary | ICD-10-CM

## 2022-05-22 DIAGNOSIS — R17 Unspecified jaundice: Secondary | ICD-10-CM

## 2022-05-22 DIAGNOSIS — Z0000 Encounter for general adult medical examination without abnormal findings: Secondary | ICD-10-CM

## 2022-05-22 DIAGNOSIS — Z131 Encounter for screening for diabetes mellitus: Secondary | ICD-10-CM

## 2022-05-22 DIAGNOSIS — Z1329 Encounter for screening for other suspected endocrine disorder: Secondary | ICD-10-CM | POA: Diagnosis not present

## 2022-05-22 DIAGNOSIS — Z1211 Encounter for screening for malignant neoplasm of colon: Secondary | ICD-10-CM

## 2022-05-22 LAB — COMPREHENSIVE METABOLIC PANEL
ALT: 14 U/L (ref 0–35)
AST: 16 U/L (ref 0–37)
Albumin: 4.2 g/dL (ref 3.5–5.2)
Alkaline Phosphatase: 76 U/L (ref 39–117)
BUN: 14 mg/dL (ref 6–23)
CO2: 29 mEq/L (ref 19–32)
Calcium: 9.5 mg/dL (ref 8.4–10.5)
Chloride: 104 mEq/L (ref 96–112)
Creatinine, Ser: 0.7 mg/dL (ref 0.40–1.20)
GFR: 94.43 mL/min (ref >60.00)
Glucose, Bld: 78 mg/dL (ref 70–99)
Potassium: 4.6 mEq/L (ref 3.5–5.1)
Sodium: 140 mEq/L (ref 135–145)
Total Bilirubin: 1.4 mg/dL — ABNORMAL HIGH (ref 0.2–1.2)
Total Protein: 6.5 g/dL (ref 6.0–8.3)

## 2022-05-22 LAB — CBC
HCT: 38.1 % (ref 36.0–46.0)
Hemoglobin: 12.8 g/dL (ref 12.0–15.0)
MCHC: 33.6 g/dL (ref 30.0–36.0)
MCV: 94.6 fl (ref 78.0–100.0)
Platelets: 198 10*3/uL (ref 150.0–400.0)
RBC: 4.02 Mil/uL (ref 3.87–5.11)
RDW: 13.3 % (ref 11.5–15.5)
WBC: 4.4 10*3/uL (ref 4.0–10.5)

## 2022-05-22 LAB — HEMOGLOBIN A1C: Hgb A1c MFr Bld: 5.4 % (ref 4.6–6.5)

## 2022-05-22 LAB — LIPID PANEL
Cholesterol: 183 mg/dL (ref 0–200)
HDL: 85.9 mg/dL (ref >39.00)
LDL Cholesterol: 84 mg/dL (ref 0–99)
NonHDL: 97.18
Total CHOL/HDL Ratio: 2
Triglycerides: 67 mg/dL (ref 0.0–149.0)
VLDL: 13.4 mg/dL (ref 0.0–40.0)

## 2022-05-22 LAB — TSH: TSH: 1.37 u[IU]/mL (ref 0.35–5.50)

## 2022-05-22 LAB — VITAMIN D 25 HYDROXY (VIT D DEFICIENCY, FRACTURES): VITD: 35.99 ng/mL (ref 30.00–100.00)

## 2022-06-06 DIAGNOSIS — K594 Anal spasm: Secondary | ICD-10-CM | POA: Diagnosis not present

## 2022-06-06 DIAGNOSIS — Z1211 Encounter for screening for malignant neoplasm of colon: Secondary | ICD-10-CM | POA: Diagnosis not present

## 2022-06-06 DIAGNOSIS — K219 Gastro-esophageal reflux disease without esophagitis: Secondary | ICD-10-CM | POA: Diagnosis not present

## 2022-06-24 ENCOUNTER — Telehealth: Payer: Self-pay | Admitting: Family Medicine

## 2022-06-24 ENCOUNTER — Encounter: Payer: Self-pay | Admitting: Family Medicine

## 2022-06-24 NOTE — Telephone Encounter (Signed)
Called pt to move appt up from the 13th to this week and triaged for SOB.

## 2022-06-24 NOTE — Telephone Encounter (Signed)
Nurse Assessment Nurse: Berna Bue, RN, Megan Date/Time (Eastern Time): 06/24/2022 8:34:38 AM Confirm and document reason for call. If symptomatic, describe symptoms. ---Caller states she is having SOB. Hx of smoking, but doesn't smoke anymore. She has hx of SOB like this before. This has been going on for about a month now. Does the patient have any new or worsening symptoms? ---Yes Will a triage be completed? ---Yes Related visit to physician within the last 2 weeks? ---No Does the PT have any chronic conditions? (i.e. diabetes, asthma, this includes High risk factors for pregnancy, etc.) ---Yes List chronic conditions. ---has albuterol PRN; but denies asthma Is this a behavioral health or substance abuse call? ---No Guidelines Guideline Title Affirmed Question Affirmed Notes Nurse Date/Time (Eastern Time) Asthma Attack [1] MILD asthma attack (e.g., no SOB at rest, mild SOB with walking, speaks normally in sentences, mild wheezing) AND [2] lasting > 24 hours on prescribed treatment Berna Bue, RN, Megan 06/24/2022 8:38:00 AM PLEASE NOTE: All timestamps contained within this report are represented as [place] Standard Time. CONFIDENTIALTY NOTICE: This fax transmission is intended only for the addressee. It contains information that is legally privileged, confidential or otherwise protected from use or disclosure. If you are not the intended recipient, you are strictly prohibited from reviewing, disclosing, copying using or disseminating any of this information or taking any action in reliance on or regarding this information. If you have received this fax in error, please notify us immediately by telephone so that we can arrange for its return to [place]. Phone: [phone], Toll-Free: [phone], Fax: [phone] Page: 2 of 2 Call Id: [id number] Disp. Time Lamount Cohen Time) Disposition Final User 06/24/2022 8:33:12 AM Send to Urgent Queue Richrd Humbles 06/24/2022 8:47:18 AM See PCP within  24 Hours Yes Berna Bue, RN, Aundra Millet Final Disposition 06/24/2022 8:47:18 AM See PCP within 24 Hours Yes Berna Bue, RN, Aundra Millet Caller Disagree/Comply Disagree Caller Understands Yes PreDisposition Call Doctor Care Advice Given Per Guideline SEE PCP WITHIN 24 HOURS: * IF OFFICE WILL BE OPEN: You need to be examined within the next 24 hours. Call your doctor (or NP/PA) when the office opens and make an appointment. ASTHMA ATTACK - TREATMENT - QUICK-RELIEF MEDICINE: * Start your quick-relief medicine (e.g., albuterol, salbutamol) at the first sign of any coughing or shortness of breath (don't wait for wheezing). * During an ASTHMA ATTACK use your SHORT-ACTING QUICK-RELIEF INHALER (4 puffs, such as ALBUTEROL) or nebulizer up to 3 times, every 20 minutes as needed. DRINK PLENTY OF LIQUIDS AND USE A HUMIDIFIER: * Drink plenty of liquids. It is important to stay well-hydrated. * If the air is dry, use a humidifier to prevent drying of the upper airway. AVOID ASTHMA TRIGGERS: * ... Air pollution * ... Allergens: Allergens can be seasonal (pollen) or year-long (house dust mites, mold, animal dander) * Avoid triggers that you know cause your asthma attacks. Some common asthma attack triggers are: CALL BACK IF: * Moderate asthma attack (audible wheezes, breathing difficulty) and not better after 2 or 3 inhaler or nebulizer treatments given 20 minutes apart * You become worse CARE ADVICE given per Asthma Attack (Adult) guideline. Comments User: Ernesta Amble, RN Date/Time Lamount Cohen Time): 06/24/2022 8:43:00 AM caller states she felt SOB this morning on the way to work this morning. She used inhaler and no longer feeling SOB. Caller states her father just passed away last week. She was his care giver User: Ernesta Amble, RN Date/Time Lamount Cohen Time): 06/24/2022 8:48:35 AM Caller states she has an appointment on Thursday. She is  just going to keep appointment and wait until then to be seen. Referrals GO TO FACILITY  REFUSED

## 2022-06-27 ENCOUNTER — Ambulatory Visit (INDEPENDENT_AMBULATORY_CARE_PROVIDER_SITE_OTHER)
Admission: RE | Admit: 2022-06-27 | Discharge: 2022-06-27 | Disposition: A | Discharge status: Home / Self Care | Payer: BC Managed Care – PPO | Source: Ambulatory Visit | Attending: Family | Admitting: Family

## 2022-06-27 ENCOUNTER — Ambulatory Visit: Payer: BC Managed Care – PPO | Admitting: Family

## 2022-06-27 VITALS — BP 108/60 | HR 67 | Temp 98.2°F | Resp 18 | Wt 117.0 lb

## 2022-06-27 DIAGNOSIS — R059 Cough, unspecified: Secondary | ICD-10-CM | POA: Diagnosis not present

## 2022-06-27 DIAGNOSIS — J441 Chronic obstructive pulmonary disease with (acute) exacerbation: Secondary | ICD-10-CM

## 2022-06-27 DIAGNOSIS — R0602 Shortness of breath: Secondary | ICD-10-CM | POA: Diagnosis not present

## 2022-06-27 DIAGNOSIS — R079 Chest pain, unspecified: Secondary | ICD-10-CM | POA: Diagnosis not present

## 2022-06-27 DIAGNOSIS — R053 Chronic cough: Secondary | ICD-10-CM

## 2022-06-27 DIAGNOSIS — J439 Emphysema, unspecified: Secondary | ICD-10-CM | POA: Diagnosis not present

## 2022-06-27 MED ORDER — PREDNISONE 20 MG PO TABS: ORAL_TABLET | ORAL | 0 refills | Status: DC

## 2022-06-27 MED ORDER — DOXYCYCLINE HYCLATE 100 MG PO TABS: 100.0000 mg | ORAL_TABLET | Freq: Two times a day (BID) | ORAL | 0 refills | Status: DC

## 2022-06-27 NOTE — Progress Notes (Signed)
Michele Jacobs is a 61 y.o. 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 90 tablet 3   cholecalciferol (VITAMIN D) 1000 units tablet Take 1,000 Units by mouth daily.     doxycycline (VIBRA-TABS) 100 MG tablet Take 1 tablet (100 mg total) by mouth 2 (two) times daily. 14 tablet 0   predniSONE (DELTASONE) 20 MG tablet Take 2 tablets daily x 2 days, then 1 tablet daily x 5 days 9 tablet 0   TOPROL XL 25 MG 24 hr tablet TAKE ONE TABLET BY MOUTH DAILY 90 tablet 1   VENTOLIN HFA 108 (90 Base) MCG/ACT inhaler INHALE 2 PUFFS INTO THE LUNGS EVERY 6 HOURS AS NEEDED FOR WHEEZING OR FOR SHORTNESS OF BREATH 18 g 6   No current facility-administered medications for this visit.    Allergies: Codeine  Past Medical History:  Diagnosis Date   Anxiety    Anxiety state 04/02/2013   Overview:  STORY: See history dated 01/19/2010`E1o3L`IMPRESSION: Continue medications as prescribed.  Relaxation techniques described.  Using Xanax prn.  May call for refills.  Using xanax 1/2 tablet qHS prn.  Last Assessment & Plan:  Worsening since her father became terminally ill. History of the same with similar presentation. 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  Repeat 2014`E1o3L`IMPRESSION: Continue calcium 1200mg  daily with vitamin D.   Ear pain, bilateral 08/22/2014   Last Assessment & Plan:  May be related to eustachian tube dysfunction. Advised antihistamine-decongestant and Flonase Daily for the next 10-14 days. If symptoms do not improve, or if they worsen, notify me.   Increased frequency of urination 08/12/2012   Laryngopharyngeal reflux (LPR) 07/15/2017   Mitral valve prolapse    Muscle cramps 08/22/2014   Last Assessment & Plan:  Relative dehydration possibly. Labs ordered. Advised trial of magnesium supplement, 400-800 milligrams nightly over the next few weeks.   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  The goal for anyone who smokes is to decrease the amount smoked each day and then eventually quit.  Stop smoking groups, printed information and medications are all available for your use when you decide to stop. She ONLY smokes if she has had quite a few drinks. jmw    Past Surgical History:  Procedure Laterality Date   THYROGLOSSAL DUCT CYST  01/2018   removal  TUBAL LIGATION      Family History  Problem Relation Age of Onset   Ovarian cancer Mother    Cancer Mother    Lymphoma Father    Heart disease Father    Cancer Father    Non-Hodgkin's lymphoma Father    Atrial fibrillation Father    Obesity Sister     Social History   Tobacco Use   Smoking status: Former    Packs/day: 0.50    Years: 30.00    Total pack years: 15.00    Types: Cigarettes    Quit date: 2013    Years since quitting: 10.5   Smokeless tobacco: Never  Substance Use Topics   Alcohol use: No    Subjective:   Presents with  complaints of atypical chest pain/ shortness of breath x 3 weeks; no chest pain on exertion; history of COPD but no improvement with her albuterol;  Cardiology exam was updated in April 2023; Last saw her pulmonologist in 09/27/21;  Father passed away unexpectedly 2 weeks ago- did have pneumonia; patient notes that she and her father both started with cold symptoms prior to onset of their symptoms; no fever; able to sleep during the day/ no coughing;  Limited relief with albuterol;     Objective:  Vitals:   06/27/22 0816  BP: 108/60  Pulse: 67  Resp: 18  Temp: 98.2 F (36.8 C)  TempSrc: Oral  SpO2: 99%  Weight: 171 lb 6.4 oz (77.7 kg)    General: Well developed, well nourished, in no acute distress  Skin : Warm and dry.  Head: Normocephalic and atraumatic  Eyes: Sclera and conjunctiva clear; pupils round and reactive to light; extraocular movements intact  Ears: External normal; canals clear; tympanic membranes normal  Oropharynx: Pink, supple. No suspicious lesions  Neck: Supple without thyromegaly, adenopathy  Lungs: Respirations unlabored; clear to auscultation bilaterally without wheeze, rales, rhonchi  CVS exam: normal rate and regular rhythm.  Neurologic: Alert and oriented; speech intact; face symmetrical; moves all extremities well; CNII-XII intact without focal deficit  Assessment:  1. Chest pain, unspecified type   2. SOB (shortness of breath)   3. Persistent cough for 3 weeks or longer   4. COPD with acute exacerbation (HCC)     Plan:  EKG shows sinus rhythm; will update d-dimer today; update CXR today;  Rx for Doxycycline 100 mg bid and Prednisone to take as directed; increase fluids, rest and follow up to be determined;   No follow-ups on file.  Orders Placed This Encounter  Procedures   DG Chest 2 View    Standing Status:   Future    Standing Expiration Date:   06/28/2023    Order Specific Question:   Reason for Exam (SYMPTOM  OR DIAGNOSIS REQUIRED)     Answer:   persistent cough    Order Specific Question:   Is patient pregnant?    Answer:   No    Order Specific Question:   Preferred imaging location?    Answer:   [facility]   EKG 12-Lead    Requested Prescriptions   Signed Prescriptions Disp Refills   doxycycline (VIBRA-TABS) 100 MG tablet 14 tablet 0    Sig: Take 1 tablet (100 mg total) by mouth 2 (two) times daily.   predniSONE (DELTASONE) 20 MG tablet 9 tablet 0    Sig: Take 2 tablets daily x 2 days, then 1 tablet daily x 5 days

## 2022-06-28 ENCOUNTER — Encounter: Payer: Self-pay | Admitting: Family

## 2022-06-28 ENCOUNTER — Other Ambulatory Visit: Payer: Self-pay | Admitting: Family

## 2022-06-28 ENCOUNTER — Encounter: Payer: Self-pay | Admitting: Family Medicine

## 2022-06-28 DIAGNOSIS — R079 Chest pain, unspecified: Secondary | ICD-10-CM

## 2022-06-28 LAB — D-DIMER, QUANTITATIVE: D-Dimer, Quant: 0.52 mcg/mL FEU — ABNORMAL HIGH (ref <0.50)

## 2022-06-28 NOTE — Telephone Encounter (Signed)
Error

## 2022-07-03 ENCOUNTER — Other Ambulatory Visit: Payer: Self-pay | Admitting: Family Medicine

## 2022-07-03 DIAGNOSIS — F411 Generalized anxiety disorder: Secondary | ICD-10-CM

## 2022-07-04 ENCOUNTER — Ambulatory Visit: Payer: BC Managed Care – PPO | Admitting: Family Medicine

## 2022-07-04 MED ORDER — ALPRAZOLAM 0.25 MG PO TABS: ORAL_TABLET | ORAL | 0 refills | Status: DC

## 2022-07-09 ENCOUNTER — Other Ambulatory Visit: Payer: BC Managed Care – PPO

## 2022-07-09 ENCOUNTER — Telehealth: Payer: Self-pay | Admitting: Family Medicine

## 2022-07-09 NOTE — Telephone Encounter (Signed)
Pt stated she thought it was sent to [facility] on [address] but when she called they stated they do not have the orders. Please advise.

## 2022-07-09 NOTE — Telephone Encounter (Signed)
Pt stated they lost the order again, and she needs it resent.

## 2022-07-31 DIAGNOSIS — Z1211 Encounter for screening for malignant neoplasm of colon: Secondary | ICD-10-CM | POA: Diagnosis not present

## 2022-07-31 LAB — HM COLONOSCOPY

## 2022-08-14 DIAGNOSIS — R079 Chest pain, unspecified: Secondary | ICD-10-CM | POA: Diagnosis not present

## 2022-09-15 ENCOUNTER — Encounter: Payer: Self-pay | Admitting: Family Medicine

## 2022-09-16 DIAGNOSIS — Z87891 Personal history of nicotine dependence: Secondary | ICD-10-CM | POA: Diagnosis not present

## 2022-09-16 DIAGNOSIS — Z122 Encounter for screening for malignant neoplasm of respiratory organs: Secondary | ICD-10-CM | POA: Diagnosis not present

## 2022-10-01 ENCOUNTER — Encounter: Payer: Self-pay | Admitting: Emergency Medicine

## 2022-10-01 ENCOUNTER — Ambulatory Visit: Payer: BC Managed Care – PPO | Admitting: Emergency Medicine

## 2022-10-01 ENCOUNTER — Ambulatory Visit
Admission: RE | Admit: 2022-10-01 | Discharge: 2022-10-01 | Disposition: A | Discharge status: Home / Self Care | Payer: Self-pay | Source: Ambulatory Visit | Attending: Emergency Medicine | Admitting: Emergency Medicine

## 2022-10-01 DIAGNOSIS — F17200 Nicotine dependence, unspecified, uncomplicated: Secondary | ICD-10-CM

## 2022-10-01 DIAGNOSIS — J449 Chronic obstructive pulmonary disease, unspecified: Secondary | ICD-10-CM

## 2022-10-01 NOTE — Progress Notes (Signed)
Subjective:    Patient ID: Michele Jacobs, female    DOB: 1961/05/11, 61 y.o.   MRN: [id number]  HPI  ROV 10/01/2022 --Michele Jacobs is 61 years old, former smoker with mild COPD.  Also with a history of MVP, GERD, anxiety and a right lower lobe posterior opacity that we had followed and cleared on lung imaging.  She is not currently on maintenance bronchodilator therapy, has albuterol and uses several times a week as below.  Today she describes that overall she has been doing ok - she has had COVID and another viral infection since I last saw her that caused her to be very dyspneic, ? RSV in June. She is noticing more exertional SOB than in past years. Still able to exert and has a stable functional capacity. Using albuterol about 0-2x a day. She was treated with pred and doxy when she was ill most recently.   Lung cancer screening CT chest 09/16/2022 ([facility]).  Reviewed by me. There were no suspicious nodules noted, judged to be a RADS 1 study.  She needs a repeat in 1 year.     Review of Systems As per HPI  Past Medical History:  Diagnosis Date   Anxiety    Anxiety state 04/02/2013   Overview:  STORY: See history dated 01/19/2010`E1o3L`IMPRESSION: Continue medications as prescribed.  Relaxation techniques described.  Using Xanax prn.  May call for refills.  Using xanax 1/2 tablet qHS prn.  Last Assessment & Plan:  Worsening since her father became terminally ill. History of the same with similar presentation. 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  Repeat 2014`E1o3L`IMPRESSION: Continue calcium 1200mg  daily with vitamin D.   Ear pain, bilateral 08/22/2014   Last Assessment & Plan:  May be related to eustachian tube dysfunction. Advised antihistamine-decongestant and Flonase Daily for the next 10-14 days. If symptoms do not improve, or if they worsen,  notify me.   Increased frequency of urination 08/12/2012   Laryngopharyngeal reflux (LPR) 07/15/2017   Mitral valve prolapse    Muscle cramps 08/22/2014   Last Assessment & Plan:  Relative dehydration possibly. Labs ordered. Advised trial of magnesium supplement, 400-800 milligrams nightly over the next few weeks.   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  The goal for anyone who smokes is to decrease the amount smoked each day and then eventually quit.  Stop smoking groups, printed information and medications are all available for your use when you decide to stop. She ONLY smokes if she has had quite a few drinks. jmw     Family History  Problem Relation Age of Onset   Ovarian cancer Mother    Cancer Mother    Lymphoma Father    Heart disease Father    Cancer Father    Non-Hodgkin's lymphoma Father    Atrial fibrillation Father    Obesity Sister      Social History   Socioeconomic History   Marital status: Single    Spouse name: Not on file   Number of children: Not on file   Years of education: Not on file   Highest education level: Not on file  Occupational History  Occupation: bookkeeper  Tobacco Use   Smoking status: Former    Packs/day: 0.50    Years: 30.00    Total pack years: 15.00    Types: Cigarettes    Quit date: 2013    Years since quitting: 10.7   Smokeless tobacco: Never  Vaping Use   Vaping Use: Never used  Substance and Sexual Activity   Alcohol use: No   Drug use: No   Sexual activity: Never    Birth control/protection: Surgical  Other Topics Concern   Not on file  Social History Narrative   Lives alone;   No children   Bookkeeper   Social Determinants of Corporate investment banker Strain: Not on file  Food Insecurity: Not on file  Transportation Needs: Not on file  Physical Activity: Not on  file  Stress: Not on file  Social Connections: Not on file  Intimate Partner Violence: Not on file     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 No inhaled exposures.   Allergies  Allergen Reactions   Codeine Nausea And Vomiting     Outpatient Medications Prior to Visit  Medication Sig Dispense Refill   ALPRAZolam (XANAX) 0.25 MG tablet TAKE ONE TABLET BY MOUTH DAILY AS NEEDED FOR ANXIETY Strength: 0.25 mg 30 tablet 0   aspirin EC 81 MG tablet Take 1 tablet (81 mg total) by mouth daily. 90 tablet 3   cholecalciferol (VITAMIN D) 1000 units tablet Take 1,000 Units by mouth daily.     TOPROL XL 25 MG 24 hr tablet TAKE ONE TABLET BY MOUTH DAILY 90 tablet 1   VENTOLIN HFA 108 (90 Base) MCG/ACT inhaler INHALE 2 PUFFS INTO THE LUNGS EVERY 6 HOURS AS NEEDED FOR WHEEZING OR FOR SHORTNESS OF BREATH 18 g 6   doxycycline (VIBRA-TABS) 100 MG tablet Take 1 tablet (100 mg total) by mouth 2 (two) times daily. 14 tablet 0   predniSONE (DELTASONE) 20 MG tablet Take 2 tablets daily x 2 days, then 1 tablet daily x 5 days 9 tablet 0   No facility-administered medications prior to visit.         Objective:   Physical Exam Vitals:   10/01/22 0907  BP: 102/60  Pulse: 61  Temp: 97.7 F (36.5 C)  TempSrc: Oral  SpO2: 98%  Weight: 119 lb 3.2 oz (54.1 kg)  Height: 5\' 5"  (1.651 m)   Gen: Pleasant, well-nourished, in no distress,  normal affect  ENT: No lesions,  mouth clear,  oropharynx clear, no postnasal drip  Neck: No JVD, no stridor  Lungs: No use of accessory muscles, no crackles or wheezing on normal respiration, no wheeze on forced expiration  Cardiovascular: RRR, heart sounds normal, no murmur or gallops, no peripheral edema  Musculoskeletal: No deformities, no cyanosis or clubbing  Neuro: alert, awake, non focal  Skin: Warm, no lesions or rash      Assessment & Plan:  COPD, mild (HCC) Overall stable although she has had some labile symptoms in the setting of viral infections since  I last saw her.  She was treated with prednisone and antibiotics in June.  Currently back to using albuterol intermittently.  If her usage increases, symptoms increase then I think she would be a good candidate to start scheduled LAMA.  We may decide to repeat her pulmonary function testing as well  Keep your albuterol available to use 2 puffs when needed for shortness of breath, chest tightness, wheezing. Flu shot and RSV shot are both up-to-date Consider getting  the COVID-19 vaccine this fall Follow with Dr. Lamonte Sakai in 12 months or sooner if you have any problems.   Tobacco use disorder We reviewed your CT scan of the chest today.  You need a repeat screening CT in September 2024 at [facility].  Baltazar Apo, MD, PhD 10/01/2022, 9:25 AM [facility] [phone] or if no answer [phone]

## 2022-10-01 NOTE — Assessment & Plan Note (Signed)
Overall stable although she has had some labile symptoms in the setting of viral infections since I last saw her.  She was treated with prednisone and antibiotics in June.  Currently back to using albuterol intermittently.  If her usage increases, symptoms increase then I think she would be a good candidate to start scheduled LAMA.  We may decide to repeat her pulmonary function testing as well  Keep your albuterol available to use 2 puffs when needed for shortness of breath, chest tightness, wheezing. Flu shot and RSV shot are both up-to-date Consider getting the COVID-19 vaccine this fall Follow with Dr. Lamonte Sakai in 12 months or sooner if you have any problems.

## 2022-10-01 NOTE — Patient Instructions (Signed)
We reviewed your CT scan of the chest today.  You need a repeat screening CT in September 2024 at [facility]. Keep your albuterol available to use 2 puffs when needed for shortness of breath, chest tightness, wheezing. Flu shot and RSV shot are both up-to-date Consider getting the COVID-19 vaccine this fall Follow with Dr. Lamonte Sakai in 12 months or sooner if you have any problems.

## 2022-10-01 NOTE — Addendum Note (Signed)
Addended by: Gavin Potters R on: 10/01/2022 11:52 AM   Modules accepted: Orders

## 2022-10-01 NOTE — Assessment & Plan Note (Signed)
We reviewed your CT scan of the chest today.  You need a repeat screening CT in September 2024 at [facility].

## 2022-10-31 ENCOUNTER — Other Ambulatory Visit: Payer: Self-pay | Admitting: Family Medicine

## 2022-10-31 DIAGNOSIS — F411 Generalized anxiety disorder: Secondary | ICD-10-CM

## 2022-10-31 MED ORDER — ALPRAZOLAM 0.25 MG PO TABS: ORAL_TABLET | ORAL | 0 refills | Status: DC

## 2022-11-06 ENCOUNTER — Other Ambulatory Visit: Payer: Self-pay | Admitting: Family Medicine

## 2022-11-06 DIAGNOSIS — R002 Palpitations: Secondary | ICD-10-CM

## 2022-12-03 DIAGNOSIS — Z124 Encounter for screening for malignant neoplasm of cervix: Secondary | ICD-10-CM | POA: Diagnosis not present

## 2022-12-03 DIAGNOSIS — Z1231 Encounter for screening mammogram for malignant neoplasm of breast: Secondary | ICD-10-CM | POA: Diagnosis not present

## 2022-12-03 DIAGNOSIS — Z01419 Encounter for gynecological examination (general) (routine) without abnormal findings: Secondary | ICD-10-CM | POA: Diagnosis not present

## 2022-12-03 DIAGNOSIS — Z1151 Encounter for screening for human papillomavirus (HPV): Secondary | ICD-10-CM | POA: Diagnosis not present

## 2022-12-03 DIAGNOSIS — Z6820 Body mass index (BMI) 20.0-20.9, adult: Secondary | ICD-10-CM | POA: Diagnosis not present

## 2022-12-04 LAB — HM PAP SMEAR: HPV, high-risk: NEGATIVE

## 2023-01-26 ENCOUNTER — Encounter (INDEPENDENT_AMBULATORY_CARE_PROVIDER_SITE_OTHER): Payer: BC Managed Care – PPO | Admitting: Family Medicine

## 2023-01-26 DIAGNOSIS — U071 COVID-19: Secondary | ICD-10-CM | POA: Diagnosis not present

## 2023-01-26 MED ORDER — NIRMATRELVIR/RITONAVIR (PAXLOVID)TABLET: 3.0000 | ORAL_TABLET | Freq: Two times a day (BID) | ORAL | 0 refills | Status: AC

## 2023-01-26 NOTE — Telephone Encounter (Signed)

## 2023-03-24 ENCOUNTER — Other Ambulatory Visit: Payer: Self-pay | Admitting: Family Medicine

## 2023-03-24 DIAGNOSIS — F411 Generalized anxiety disorder: Secondary | ICD-10-CM

## 2023-03-25 ENCOUNTER — Encounter: Payer: Self-pay | Admitting: Family Medicine

## 2023-03-25 MED ORDER — ALPRAZOLAM 0.25 MG PO TABS: ORAL_TABLET | ORAL | 0 refills | Status: DC

## 2023-05-04 ENCOUNTER — Other Ambulatory Visit: Payer: Self-pay | Admitting: Family Medicine

## 2023-05-04 DIAGNOSIS — R002 Palpitations: Secondary | ICD-10-CM

## 2023-05-20 NOTE — Progress Notes (Signed)
[facility] [address] [place], [place] [address] [phone]  Date:  05/26/2023   Name:  Michele Jacobs   DOB:  01/16/1961   MRN:  [id number]  PCP:  Pearline Cables, MD    Chief Complaint: Annual Exam (Concerns/ questions: none/Shingrix- none, declines/Pap- done 11/2022 [facility]- rec req sent)   History of Present Illness:  Michele Jacobs is a 62 y.o. very pleasant female patient who presents with the following:  Patient seen today for physical exam- History of osteopenia, MVP, reflux, anxiety, palpitations treated with toprol xl. Most recent visit with myself was about 1 year ago for her last physical  She is followed by pulmonology, Dr.Byrum for COPD-most recent visit was in October.  It looks like she had a CT done at [facility], they recommend a follow-up this coming September.  Pt is aware  She was seen by her gynecologist, Dr. Renaldo Fiddler in December, 12/04/22 Shingrix- pt declines, she is worried about possible SE Pap screening- UTD per GYN Mammogram December 2022, can be updated unless already done- done 12/04/22 Update lab work today Colon:8/23   Pt notes she had had a stressful year but her health is ok  She gets regular exercise, tends to get on her treadmill 3 times a day.  She notes no chest pain or shortness of breath However, for the last 6 to 8 months she has noted bilateral anterior thigh soreness, which reminds her of soreness one might experience after hard workout.  She has not changed anything about her workout routine.  She does not have pain particular when she is actually walking or exercising.  No pain or cramping in her calves. No redness, other visible changes of her legs.  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  Relaxation techniques described.  Using Xanax prn.  May call for refills.  Using xanax 1/2 tablet qHS prn.  Last Assessment & Plan:  Worsening since her father became terminally ill. History of the same with similar presentation. 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  Repeat 2014`E1o3L`IMPRESSION: Continue calcium 1200mg  daily with vitamin D.   Ear pain, bilateral 08/22/2014   Last Assessment & Plan:  May be related to eustachian tube dysfunction. Advised antihistamine-decongestant and Flonase Daily for the next 10-14 days. If symptoms do not improve, or if they worsen, notify me.   Increased frequency of urination 08/12/2012   Laryngopharyngeal reflux (LPR) 07/15/2017   Mitral  valve prolapse    Muscle cramps 08/22/2014   Last Assessment & Plan:  Relative dehydration possibly. Labs ordered. Advised trial of magnesium supplement, 400-800 milligrams nightly over the next few weeks.   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  The goal for anyone who smokes is to decrease the amount smoked each day and  then eventually quit.  Stop smoking groups, printed information and medications are all available for your use when you decide to stop. She ONLY smokes if she has had quite a few drinks. jmw    Past Surgical History:  Procedure Laterality Date   THYROGLOSSAL DUCT CYST  01/2018   removal   TUBAL LIGATION      Social History   Tobacco Use   Smoking status: Former    Packs/day: 0.50    Years: 30.00    Additional pack years: 0.00    Total pack years: 15.00    Types: Cigarettes    Quit date: 2013    Years since quitting: 11.4   Smokeless tobacco: Never  Vaping Use   Vaping Use: Never used  Substance Use Topics   Alcohol use: No   Drug use: No    Family History  Problem Relation Age of Onset   Ovarian cancer Mother    Cancer Mother    Lymphoma Father    Heart disease Father    Cancer Father    Non-Hodgkin's lymphoma Father    Atrial fibrillation Father    Obesity Sister     Allergies  Allergen Reactions   Codeine Nausea And Vomiting    Medication list has been reviewed and updated.  Current Outpatient Medications on File Prior to Visit  Medication Sig Dispense Refill   ALPRAZolam (XANAX) 0.25 MG tablet TAKE ONE TABLET BY MOUTH DAILY AS NEEDED FOR ANXIETY Strength: 0.25 mg 30 tablet 0   aspirin EC 81 MG tablet Take 1 tablet (81 mg total) by mouth daily. 90 tablet 3   cholecalciferol (VITAMIN D) 1000 units tablet Take 1,000 Units by mouth daily.     metoprolol succinate (TOPROL XL) 25 MG 24 hr tablet Take 1 tablet (25 mg total) by mouth daily. Take with or immediately following a meal 90 tablet 1   VENTOLIN HFA 108 (90 Base) MCG/ACT inhaler INHALE 2 PUFFS INTO THE LUNGS EVERY 6 HOURS AS NEEDED FOR WHEEZING OR FOR SHORTNESS OF BREATH 18 g 6   No current facility-administered medications on file prior to visit.    Review of Systems:  As per HPI- otherwise negative.   Physical Examination: Vitals:   05/26/23 0808  BP: 114/72  Pulse: (!) 51  Resp: 18  Temp:  97.6 F (36.4 C)  SpO2: 98%   Vitals:   05/26/23 0808  Weight: 118 lb (53.5 kg)  Height: 5\' 6"  (1.676 m)   Body mass index is 19.05 kg/m. Ideal Body Weight: Weight in (lb) to have BMI = 25: 154.6  GEN: no acute distress. Slender build, looks well  HEENT: Atraumatic, Normocephalic.  Ears and Nose: No external deformity. CV: RRR, No M/G/R. No JVD. No thrill. No extra heart sounds. PULM: CTA B, no wheezes, crackles, rhonchi. No retractions. No resp. distress. No accessory muscle use. ABD: S, NT, ND, +BS. No rebound. No HSM. EXTR: No c/c/e PSYCH: Normally interactive. Conversant.   Weight is stable -  pt notes weight of 171 is incorrect, we presume 117  Wt Readings from Last 3 Encounters:  05/26/23 118 lb (53.5 kg)  10/01/22 119 lb 3.2 oz (54.1 kg)  06/27/22 171 lb 6.4 oz (77.7 kg)   Pulse Readings from Last 3 Encounters:  05/26/23 (!) 51  10/01/22 61  06/27/22 67    Assessment and Plan: Physical exam  Screening for diabetes mellitus - Plan: Comprehensive metabolic panel, Hemoglobin A1c  Heart palpitations  Screening for hyperlipidemia - Plan: Lipid panel  Screening for deficiency anemia - Plan: CBC  Screening for thyroid disorder - Plan: TSH  COPD, mild (HCC)  Osteopenia, unspecified location - Plan: VITAMIN D 25 Hydroxy (Vit-D Deficiency, Fractures)  Fatigue, unspecified type - Plan: VITAMIN D 25 Hydroxy (Vit-D Deficiency, Fractures)  Vitamin D deficiency - Plan: VITAMIN D 25 Hydroxy (Vit-D Deficiency, Fractures)  Pain in both thighs - Plan: CK (Creatine Kinase)  Skin irritation - Plan: triamcinolone cream (KENALOG) 0.5 %  Physical exam today.  Encouraged healthy diet and exercise routine Will plan further follow- up pending labs. Refilled triamcinolone cream which she uses on rare occasion, she notes she has had her current tube for "15 years"  Patient has noted persistent bilateral quadricep tenderness for 6 to 8 months.  We will check a CK.  If  negative, would recommend proceeding to x-rays of her hips and possibly ABI testing Pt notes is x-rays are needed she prefers [facility] imaging for cost savings  Signed Abbe Amsterdam, MD  Received labs as below, message to pt  Results for orders placed or performed in visit on 05/26/23  CBC  Result Value Ref Range   WBC 5.4 4.0 - 10.5 K/uL   RBC 4.18 3.87 - 5.11 Mil/uL   Platelets 221.0 150.0 - 400.0 K/uL   Hemoglobin 13.2 12.0 - 15.0 g/dL   HCT 16.1 09.6 - 04.5 %   MCV 96.3 78.0 - 100.0 fl   MCHC 32.9 30.0 - 36.0 g/dL   RDW 40.9 81.1 - 91.4 %  Comprehensive metabolic panel  Result Value Ref Range   Sodium 140 135 - 145 mEq/L   Potassium 4.3 3.5 - 5.1 mEq/L   Chloride 102 96 - 112 mEq/L   CO2 30 19 - 32 mEq/L   Glucose, Bld 82 70 - 99 mg/dL   BUN 16 6 - 23 mg/dL   Creatinine, Ser 7.82 0.40 - 1.20 mg/dL   Total Bilirubin 1.3 (H) 0.2 - 1.2 mg/dL   Alkaline Phosphatase 79 39 - 117 U/L   AST 17 0 - 37 U/L   ALT 14 0 - 35 U/L   Total Protein 6.7 6.0 - 8.3 g/dL   Albumin 4.4 3.5 - 5.2 g/dL   GFR 95.62 >13.08 mL/min   Calcium 9.5 8.4 - 10.5 mg/dL  Hemoglobin M5H  Result Value Ref Range   Hgb A1c MFr Bld 5.2 4.6 - 6.5 %  Lipid panel  Result Value Ref Range   Cholesterol 189 0 - 200 mg/dL   Triglycerides 84.6 0.0 - 149.0 mg/dL   HDL 96.29 >52.84 mg/dL   VLDL 13.2 0.0 - 44.0 mg/dL   LDL Cholesterol 91 0 - 99 mg/dL   Total CHOL/HDL Ratio 2    NonHDL 104.10   TSH  Result Value Ref Range   TSH 0.97 0.35 - 5.50 uIU/mL  VITAMIN D 25 Hydroxy (Vit-D Deficiency, Fractures)  Result Value Ref Range   VITD 49.12 30.00 - 100.00 ng/mL  CK (Creatine Kinase)  Result Value Ref Range   Total CK 49 7 - 177 U/L   Pt would like to proceed with x-rays and ABI to investigate leg pain- will order

## 2023-05-20 NOTE — Patient Instructions (Addendum)
It was nice to see you again today, I will be in touch with your labs soon as possible Please consider getting the shingles vaccine series!   Assuming you labs are ok I would suggest getting x-rays of your hips and thigh bones

## 2023-05-26 ENCOUNTER — Encounter: Payer: Self-pay | Admitting: Family Medicine

## 2023-05-26 ENCOUNTER — Ambulatory Visit (INDEPENDENT_AMBULATORY_CARE_PROVIDER_SITE_OTHER): Payer: BC Managed Care – PPO | Admitting: Family Medicine

## 2023-05-26 VITALS — BP 114/72 | HR 51 | Temp 97.6°F | Resp 18 | Ht 66.0 in | Wt 118.0 lb

## 2023-05-26 DIAGNOSIS — M79651 Pain in right thigh: Secondary | ICD-10-CM

## 2023-05-26 DIAGNOSIS — M79652 Pain in left thigh: Secondary | ICD-10-CM | POA: Diagnosis not present

## 2023-05-26 DIAGNOSIS — M8580 Other specified disorders of bone density and structure, unspecified site: Secondary | ICD-10-CM | POA: Diagnosis not present

## 2023-05-26 DIAGNOSIS — E559 Vitamin D deficiency, unspecified: Secondary | ICD-10-CM

## 2023-05-26 DIAGNOSIS — Z0000 Encounter for general adult medical examination without abnormal findings: Secondary | ICD-10-CM

## 2023-05-26 DIAGNOSIS — Z13220 Encounter for screening for lipoid disorders: Secondary | ICD-10-CM

## 2023-05-26 DIAGNOSIS — M79604 Pain in right leg: Secondary | ICD-10-CM

## 2023-05-26 DIAGNOSIS — Z1329 Encounter for screening for other suspected endocrine disorder: Secondary | ICD-10-CM

## 2023-05-26 DIAGNOSIS — R002 Palpitations: Secondary | ICD-10-CM

## 2023-05-26 DIAGNOSIS — J449 Chronic obstructive pulmonary disease, unspecified: Secondary | ICD-10-CM

## 2023-05-26 DIAGNOSIS — R5383 Other fatigue: Secondary | ICD-10-CM

## 2023-05-26 DIAGNOSIS — R238 Other skin changes: Secondary | ICD-10-CM

## 2023-05-26 DIAGNOSIS — Z130 Encounter for screening for diseases of the blood and blood-forming organs and certain disorders involving the immune mechanism: Secondary | ICD-10-CM

## 2023-05-26 DIAGNOSIS — Z131 Encounter for screening for diabetes mellitus: Secondary | ICD-10-CM

## 2023-05-26 LAB — VITAMIN D 25 HYDROXY (VIT D DEFICIENCY, FRACTURES): VITD: 49.12 ng/mL (ref 30.00–100.00)

## 2023-05-26 LAB — CBC
HCT: 40.2 % (ref 36.0–46.0)
Hemoglobin: 13.2 g/dL (ref 12.0–15.0)
MCHC: 32.9 g/dL (ref 30.0–36.0)
MCV: 96.3 fl (ref 78.0–100.0)
Platelets: 221 10*3/uL (ref 150.0–400.0)
RBC: 4.18 Mil/uL (ref 3.87–5.11)
RDW: 13.2 % (ref 11.5–15.5)
WBC: 5.4 10*3/uL (ref 4.0–10.5)

## 2023-05-26 LAB — COMPREHENSIVE METABOLIC PANEL
ALT: 14 U/L (ref 0–35)
AST: 17 U/L (ref 0–37)
Albumin: 4.4 g/dL (ref 3.5–5.2)
Alkaline Phosphatase: 79 U/L (ref 39–117)
BUN: 16 mg/dL (ref 6–23)
CO2: 30 mEq/L (ref 19–32)
Calcium: 9.5 mg/dL (ref 8.4–10.5)
Chloride: 102 mEq/L (ref 96–112)
Creatinine, Ser: 0.75 mg/dL (ref 0.40–1.20)
GFR: 86.31 mL/min (ref >60.00)
Glucose, Bld: 82 mg/dL (ref 70–99)
Potassium: 4.3 mEq/L (ref 3.5–5.1)
Sodium: 140 mEq/L (ref 135–145)
Total Bilirubin: 1.3 mg/dL — ABNORMAL HIGH (ref 0.2–1.2)
Total Protein: 6.7 g/dL (ref 6.0–8.3)

## 2023-05-26 LAB — HEMOGLOBIN A1C: Hgb A1c MFr Bld: 5.2 % (ref 4.6–6.5)

## 2023-05-26 LAB — TSH: TSH: 0.97 u[IU]/mL (ref 0.35–5.50)

## 2023-05-26 LAB — CK: Total CK: 49 U/L (ref 7–177)

## 2023-05-26 LAB — LIPID PANEL
Cholesterol: 189 mg/dL (ref 0–200)
HDL: 85.3 mg/dL (ref >39.00)
LDL Cholesterol: 91 mg/dL (ref 0–99)
NonHDL: 104.1
Total CHOL/HDL Ratio: 2
Triglycerides: 66 mg/dL (ref 0.0–149.0)
VLDL: 13.2 mg/dL (ref 0.0–40.0)

## 2023-05-26 MED ORDER — TRIAMCINOLONE ACETONIDE 0.5 % EX CREA
1.0000 | TOPICAL_CREAM | Freq: Two times a day (BID) | CUTANEOUS | 0 refills | Status: AC
Start: 2023-05-26 — End: ?

## 2023-05-26 NOTE — Patient Instructions (Signed)
Good to see you today- I will be in touch with your labs If your CK is normal I would suggest getting a

## 2023-05-26 NOTE — Addendum Note (Signed)
Addended by: Abbe Amsterdam C on: 05/26/2023 08:45 PM   Modules accepted: Orders

## 2023-05-27 NOTE — Addendum Note (Signed)
Addended by: Abbe Amsterdam C on: 05/27/2023 09:56 AM   Modules accepted: Orders

## 2023-05-29 DIAGNOSIS — M79651 Pain in right thigh: Secondary | ICD-10-CM | POA: Diagnosis not present

## 2023-05-29 DIAGNOSIS — M79605 Pain in left leg: Secondary | ICD-10-CM | POA: Diagnosis not present

## 2023-05-29 DIAGNOSIS — M419 Scoliosis, unspecified: Secondary | ICD-10-CM | POA: Diagnosis not present

## 2023-05-29 DIAGNOSIS — M79604 Pain in right leg: Secondary | ICD-10-CM | POA: Diagnosis not present

## 2023-05-29 DIAGNOSIS — M47819 Spondylosis without myelopathy or radiculopathy, site unspecified: Secondary | ICD-10-CM | POA: Diagnosis not present

## 2023-05-29 DIAGNOSIS — M79652 Pain in left thigh: Secondary | ICD-10-CM | POA: Diagnosis not present

## 2023-06-02 ENCOUNTER — Ambulatory Visit (HOSPITAL_COMMUNITY)
Admission: RE | Admit: 2023-06-02 | Discharge: 2023-06-02 | Disposition: A | Discharge status: Home / Self Care | Payer: BC Managed Care – PPO | Source: Ambulatory Visit | Attending: Family Medicine | Admitting: Family Medicine

## 2023-06-02 ENCOUNTER — Encounter: Payer: Self-pay | Admitting: Family Medicine

## 2023-06-02 DIAGNOSIS — M79652 Pain in left thigh: Secondary | ICD-10-CM | POA: Diagnosis not present

## 2023-06-02 DIAGNOSIS — M79604 Pain in right leg: Secondary | ICD-10-CM

## 2023-06-02 DIAGNOSIS — M79651 Pain in right thigh: Secondary | ICD-10-CM | POA: Insufficient documentation

## 2023-06-02 LAB — VAS US ABI WITH/WO TBI
Left ABI: 1.11
Right ABI: 1.1

## 2023-06-04 NOTE — Addendum Note (Signed)
Addended by: Abbe Amsterdam C on: 06/04/2023 05:48 AM   Modules accepted: Orders

## 2023-06-20 DIAGNOSIS — M47816 Spondylosis without myelopathy or radiculopathy, lumbar region: Secondary | ICD-10-CM | POA: Diagnosis not present

## 2023-08-01 DIAGNOSIS — R9089 Other abnormal findings on diagnostic imaging of central nervous system: Secondary | ICD-10-CM | POA: Diagnosis not present

## 2023-08-19 DIAGNOSIS — K219 Gastro-esophageal reflux disease without esophagitis: Secondary | ICD-10-CM | POA: Insufficient documentation

## 2023-08-19 DIAGNOSIS — F419 Anxiety disorder, unspecified: Secondary | ICD-10-CM | POA: Insufficient documentation

## 2023-08-19 DIAGNOSIS — K594 Anal spasm: Secondary | ICD-10-CM

## 2023-08-19 HISTORY — DX: Anal spasm: K59.4

## 2023-08-19 HISTORY — DX: Gastro-esophageal reflux disease without esophagitis: K21.9

## 2023-08-20 ENCOUNTER — Encounter: Payer: Self-pay | Admitting: Family Medicine

## 2023-08-21 ENCOUNTER — Telehealth: Payer: Self-pay | Admitting: Cardiology

## 2023-08-21 ENCOUNTER — Encounter: Payer: Self-pay | Admitting: Cardiology

## 2023-08-21 ENCOUNTER — Ambulatory Visit: Payer: BC Managed Care – PPO | Attending: Cardiology | Admitting: Cardiology

## 2023-08-21 VITALS — BP 90/50 | HR 77 | Ht 65.0 in | Wt 117.0 lb

## 2023-08-21 DIAGNOSIS — I341 Nonrheumatic mitral (valve) prolapse: Secondary | ICD-10-CM

## 2023-08-21 DIAGNOSIS — R002 Palpitations: Secondary | ICD-10-CM

## 2023-08-21 NOTE — Patient Instructions (Signed)
Medication Instructions:  Your physician recommends that you continue on your current medications as directed. Please refer to the Current Medication list given to you today.  *If you need a refill on your cardiac medications before your next appointment, please call your pharmacy*   Lab Work: None ordered If you have labs (blood work) drawn today and your tests are completely normal, you will receive your results only by: MyChart Message (if you have MyChart) OR A paper copy in the mail If you have any lab test that is abnormal or we need to change your treatment, we will call you to review the results.   Testing/Procedures: Your physician has requested that you have an echocardiogram. Echocardiography is a painless test that uses sound waves to create images of your heart. It provides your doctor with information about the size and shape of your heart and how well your heart's chambers and valves are working. This procedure takes approximately one hour. There are no restrictions for this procedure. Please do NOT wear cologne, perfume, aftershave, or lotions (deodorant is allowed). Please arrive 15 minutes prior to your appointment time.     Follow-Up: At [facility], you and your health needs are our priority.  As part of our continuing mission to provide you with exceptional heart care, we have created designated Provider Care Teams.  These Care Teams include your primary Cardiologist (physician) and Advanced Practice Providers (APPs -  Physician Assistants and Nurse Practitioners) who all work together to provide you with the care you need, when you need it.  We recommend signing up for the patient portal called "MyChart".  Sign up information is provided on this After Visit Summary.  MyChart is used to connect with patients for Virtual Visits (Telemedicine).  Patients are able to view lab/test results, encounter notes, upcoming appointments, etc.  Non-urgent messages can be sent to  your provider as well.   To learn more about what you can do with MyChart, go to [email or web address].    Your next appointment:   12 month(s)  The format for your next appointment:   In Person  Provider:   Rajan Revankar, MD   Other Instructions Echocardiogram An echocardiogram is a test that uses sound waves (ultrasound) to produce images of the heart. Images from an echocardiogram can provide important information about: Heart size and shape. The size and thickness and movement of your heart's walls. Heart muscle function and strength. Heart valve function or if you have stenosis. Stenosis is when the heart valves are too narrow. If blood is flowing backward through the heart valves (regurgitation). A tumor or infectious growth around the heart valves. Areas of heart muscle that are not working well because of poor blood flow or injury from a heart attack. Aneurysm detection. An aneurysm is a weak or damaged part of an artery wall. The wall bulges out from the normal force of blood pumping through the body. Tell a health care provider about: Any allergies you have. All medicines you are taking, including vitamins, herbs, eye drops, creams, and over-the-counter medicines. Any blood disorders you have. Any surgeries you have had. Any medical conditions you have. Whether you are pregnant or may be pregnant. What are the risks? Generally, this is a safe test. However, problems may occur, including an allergic reaction to dye (contrast) that may be used during the test. What happens before the test? No specific preparation is needed. You may eat and drink normally. What happens during the test? You will   take off your clothes from the waist up and put on a hospital gown. Electrodes or electrocardiogram (ECG)patches may be placed on your chest. The electrodes or patches are then connected to a device that monitors your heart rate and rhythm. You will lie down on a table for an  ultrasound exam. A gel will be applied to your chest to help sound waves pass through your skin. A handheld device, called a transducer, will be pressed against your chest and moved over your heart. The transducer produces sound waves that travel to your heart and bounce back (or "echo" back) to the transducer. These sound waves will be captured in real-time and changed into images of your heart that can be viewed on a video monitor. The images will be recorded on a computer and reviewed by your health care provider. You may be asked to change positions or hold your breath for a short time. This makes it easier to get different views or better views of your heart. In some cases, you may receive contrast through an IV in one of your veins. This can improve the quality of the pictures from your heart. The procedure may vary among health care providers and hospitals.   What can I expect after the test? You may return to your normal, everyday life, including diet, activities, and medicines, unless your health care provider tells you not to do that. Follow these instructions at home: It is up to you to get the results of your test. Ask your health care provider, or the department that is doing the test, when your results will be ready. Keep all follow-up visits. This is important. Summary An echocardiogram is a test that uses sound waves (ultrasound) to produce images of the heart. Images from an echocardiogram can provide important information about the size and shape of your heart, heart muscle function, heart valve function, and other possible heart problems. You do not need to do anything to prepare before this test. You may eat and drink normally. After the echocardiogram is completed, you may return to your normal, everyday life, unless your health care provider tells you not to do that. This information is not intended to replace advice given to you by your health care provider. Make sure you  discuss any questions you have with your health care provider. Document Revised: 08/01/2020 Document Reviewed: 08/01/2020 Elsevier Patient Education  2021 Elsevier Inc.   Important Information About Sugar        

## 2023-08-21 NOTE — Addendum Note (Signed)
Addended by: Eleonore Chiquito on: 08/21/2023 10:06 AM   Modules accepted: Orders

## 2023-08-21 NOTE — Telephone Encounter (Signed)
They do not do echocardiograms at that location. Please let the pt know.

## 2023-08-21 NOTE — Telephone Encounter (Signed)
Patient would like to know if she can get her echo order sent to [facility] on [address]. CB # [phone]

## 2023-08-21 NOTE — Progress Notes (Signed)
Cardiology Office Note:    Date:  08/21/2023   ID:  Michele Jacobs, DOB July 06, 1961, MRN [id number]  PCP:  Pearline Cables, MD  Cardiologist:  Garwin Brothers, MD   Referring MD: Pearline Cables, MD    ASSESSMENT:    1. Palpitations   2. Mitral valve prolapse    PLAN:    In order of problems listed above:  Primary prevention stressed with the patient.  Importance of compliance with diet medication stressed and patient verbalized standing. Patient does exercise very well on a regular basis and I congratulated her about the same. Palpitations: These have completely resolved and she is happy about. History of mitral valve prolapse: We will have an echocardiogram to assess the murmur. Risk stratification with calcium scoring.  I discussed this.  She will have.  CT scan for cancer screening in view of smoking history.  She will let us know if there is any evidence of coronary artery calcifications after discussing this with her pulmonologist.  I gave this to her in writing to help her discussed the report with the pulmonologist.  If she has evidence of coronary artery calcifications she will get in touch with [place] and she may need further intervention with statin therapy like medications. Patient will be seen in follow-up appointment in 12 months or earlier if the patient has any concerns.    Medication Adjustments/Labs and Tests Ordered: Current medicines are reviewed at length with the patient today.  Concerns regarding medicines are outlined above.  Orders Placed This Encounter  Procedures   EKG 12-Lead   ECHOCARDIOGRAM COMPLETE   No orders of the defined types were placed in this encounter.    No chief complaint on file.    History of Present Illness:    Michele Jacobs is a 62 y.o. female.  Patient has past medical history of palpitations, mitral valve prolapse.  She denies any problems at this time she is a former smoker and quit a few years ago.  She denies any chest  pain orthopnea or PND.  She exercises on a regular basis.  At the time of my evaluation, the patient is alert awake oriented and in no distress.  Past Medical History:  Diagnosis Date   Anxiety    Anxiety state 04/02/2013   Overview:  STORY: See history dated 01/19/2010`E1o3L`IMPRESSION: Continue medications as prescribed.  Relaxation techniques described.  Using Xanax prn.  May call for refills.  Using xanax 1/2 tablet qHS prn.  Last Assessment & Plan:  Worsening since her father became terminally ill. History of the same with similar presentation. 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  Repeat 2014`E1o3L`IMPRESSION: Continue calcium 1200mg  daily with vitamin D.   Ear pain, bilateral 08/22/2014   Last Assessment & Plan:  May be related to eustachian tube dysfunction. Advised antihistamine-decongestant and Flonase Daily for the next 10-14 days. If symptoms do not improve, or if they worsen, notify me.   Gastroesophageal reflux disease 08/19/2023   Increased frequency of urination 08/12/2012   Laryngopharyngeal reflux (LPR) 07/15/2017   Mitral valve prolapse    Muscle cramps 08/22/2014   Last Assessment & Plan:  Relative dehydration possibly. Labs ordered. Advised trial of magnesium supplement, 400-800 milligrams nightly over the next few weeks.  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  The goal for anyone who smokes is to decrease the amount smoked each day and then eventually quit.  Stop smoking groups, printed information and medications are all available for your use when you decide to stop. She ONLY smokes if she has had quite a few  drinks. jmw    Past Surgical History:  Procedure Laterality Date   THYROGLOSSAL DUCT CYST  01/2018   removal   TUBAL LIGATION      Current Medications: Current Meds  Medication Sig   ALPRAZolam (XANAX) 0.25 MG tablet TAKE ONE TABLET BY MOUTH DAILY AS NEEDED FOR ANXIETY Strength: 0.25 mg   aspirin EC 81 MG tablet Take 1 tablet (81 mg total) by mouth daily.   cholecalciferol (VITAMIN D) 1000 units tablet Take 1,000 Units by mouth daily.   metoprolol succinate (TOPROL XL) 25 MG 24 hr tablet Take 1 tablet (25 mg total) by mouth daily. Take with or immediately following a meal   triamcinolone cream (KENALOG) 0.5 % Apply 1 Application topically 2 (two) times daily. Use as needed for skin rash   VENTOLIN HFA 108 (90 Base) MCG/ACT inhaler INHALE 2 PUFFS INTO THE LUNGS EVERY 6 HOURS AS NEEDED FOR WHEEZING OR FOR SHORTNESS OF BREATH     Allergies:   Codeine   Social History   Socioeconomic History   Marital status: Single    Spouse name: Not on file   Number of children: Not on file   Years of education: Not on file   Highest education level: Not on file  Occupational History   Occupation: bookkeeper  Tobacco Use   Smoking status: Former    Current packs/day: 0.00    Average packs/day: 0.5 packs/day for 30.0 years (15.0 ttl pk-yrs)    Types: Cigarettes    Start date: 51    Quit date: 2013    Years since quitting: 11.6   Smokeless tobacco: Never  Vaping Use   Vaping status: Never Used  Substance and Sexual Activity   Alcohol use: No   Drug use: No   Sexual activity: Never    Birth control/protection: Surgical  Other Topics Concern   Not on file  Social History Narrative   Lives alone;   No children   Bookkeeper   Social Determinants of Corporate investment banker Strain: Not on file  Food Insecurity: Not on file  Transportation Needs: Not on file  Physical Activity: Not on file  Stress: Not on file  Social Connections: Unknown (05/06/2022)   Received from [facility]   Social Network    Social Network: Not on file     Family History: The patient's family history includes Atrial fibrillation in her father; Cancer in her father and mother; Heart disease in her father; Lymphoma in her father; Non-Hodgkin's lymphoma in her father; Obesity in her sister; Ovarian cancer in her mother.  ROS:   Please see the history of present illness.    All other systems reviewed and are negative.  EKGs/Labs/Other Studies Reviewed:    The following studies were reviewed today: I discussed my findings with the patient at length. ..EKG Interpretation Date/Time:  Thursday August 21 2023 08:51:43 EDT Ventricular Rate:  77 PR Interval:  126 QRS Duration:  72 QT Interval:  378 QTC Calculation: 427 R Axis:   78  Text Interpretation: Normal sinus rhythm Right atrial enlargement Nonspecific ST abnormality When compared with ECG of 08-Jun-2019 14:40, PREVIOUS ECG IS PRESENT Confirmed by Belva Crome [phone]) on 08/21/2023 8:55:12 AM     Recent Labs: 05/26/2023: ALT 14; BUN 16; Creatinine, Ser 0.75; Hemoglobin 13.2; Platelets 221.0; Potassium 4.3; Sodium 140; TSH 0.97  Recent Lipid Panel    Component Value Date/Time   CHOL 189 05/26/2023 0835   TRIG 66.0 05/26/2023 0835   HDL 85.30 05/26/2023 0835   CHOLHDL 2 05/26/2023 0835   VLDL 13.2 05/26/2023 0835   LDLCALC 91 05/26/2023 0835    Physical Exam:    VS:  BP (!) 90/50   Pulse 77   Ht 5\' 5"  (1.651 m)   Wt 117 lb (53.1 kg)   SpO2 96%   BMI 19.47 kg/m     Wt Readings from Last 3 Encounters:  08/21/23 117 lb (53.1 kg)  05/26/23 118 lb (53.5 kg)  10/01/22 119 lb 3.2 oz (54.1 kg)     GEN: Patient is in no acute distress HEENT: Normal NECK: No JVD; No carotid bruits LYMPHATICS: No lymphadenopathy CARDIAC: Hear sounds regular, 2/6 systolic murmur at the apex. RESPIRATORY:  Clear to auscultation without rales, wheezing or rhonchi  ABDOMEN: Soft, non-tender,  non-distended MUSCULOSKELETAL:  No edema; No deformity  SKIN: Warm and dry NEUROLOGIC:  Alert and oriented x 3 PSYCHIATRIC:  Normal affect   Signed, Garwin Brothers, MD  08/21/2023 9:14 AM    [facility]

## 2023-08-21 NOTE — Addendum Note (Signed)
Addended by: Eleonore Chiquito on: 08/21/2023 09:49 AM   Modules accepted: Orders

## 2023-08-22 ENCOUNTER — Telehealth: Payer: Self-pay | Admitting: Cardiology

## 2023-08-22 NOTE — Telephone Encounter (Signed)
Request has been sent to pre-cert and [facility] in [place] as requested.

## 2023-08-22 NOTE — Addendum Note (Signed)
Addended by: Eleonore Chiquito on: 08/22/2023 10:36 AM   Modules accepted: Orders

## 2023-08-22 NOTE — Telephone Encounter (Signed)
Patient would like her echo order to now be sent over to [facility] in [place]. Their address is [address] [place], [place] [address]. Phone number is [phone]  Patient CB # [phone]

## 2023-08-29 ENCOUNTER — Telehealth: Payer: Self-pay | Admitting: Cardiology

## 2023-08-29 NOTE — Telephone Encounter (Signed)
Patient calling to find out if we had received auth on her echo to be done at [facility] number to call back [phone]

## 2023-08-29 NOTE — Telephone Encounter (Signed)
Advised that the order and percert has been faxed and confirmed receipt. Pt verbalized understanding and had no additional questions.

## 2023-09-08 ENCOUNTER — Telehealth: Payer: Self-pay | Admitting: Emergency Medicine

## 2023-09-08 NOTE — Telephone Encounter (Signed)
No order placed

## 2023-09-08 NOTE — Telephone Encounter (Signed)
Patient is calling because an order has not been placed yet for her low does CT Scan. She states that she usually gets it done at [facility] before her scheduled appointments with [facility]. She has called [facility] but they need the order. Her current appointment is Oct. 16th.  Please call and advise [phone]

## 2023-09-09 ENCOUNTER — Other Ambulatory Visit: Payer: Self-pay | Admitting: Family Medicine

## 2023-09-09 DIAGNOSIS — F411 Generalized anxiety disorder: Secondary | ICD-10-CM

## 2023-09-10 ENCOUNTER — Encounter: Payer: Self-pay | Admitting: Family Medicine

## 2023-09-12 ENCOUNTER — Encounter: Payer: Self-pay | Admitting: Family Medicine

## 2023-09-12 DIAGNOSIS — Z122 Encounter for screening for malignant neoplasm of respiratory organs: Secondary | ICD-10-CM

## 2023-09-12 NOTE — Telephone Encounter (Signed)
Hey Dr. Delton Coombes patient order has not been placed for Ct Scan

## 2023-09-12 NOTE — Telephone Encounter (Signed)
Looks like Dr Solon Augusta ordered the last scan. And Michele Jacobs sent a message to them as well. Are you okay with placing the order?

## 2023-09-12 NOTE — Telephone Encounter (Signed)
Looking through pt's chart, see that pt has had lung cancer screen CTs ordered prior by PCP.  Dr. Delton Coombes, please advise if this is the CT that pt is needing to have ordered prior to her upcoming appt with you or if there is a different CT order that you want to have placed.

## 2023-09-15 NOTE — Telephone Encounter (Signed)
It is fine for [place] to order it. I'd prefer in our system, but if she prefers / needs it to be at [place], [place] to order there.

## 2023-09-15 NOTE — Telephone Encounter (Signed)
Pt said [facility] will accept a hand delivered order. Pt is on her way to pick up copy so she can take it over to [facility].

## 2023-09-17 NOTE — Telephone Encounter (Signed)
Pt went ahead and had pcp order @ [facility]. CT scheduled 10/8 and she will bring CT to appt with Dr. Delton Coombes.

## 2023-09-17 NOTE — Telephone Encounter (Signed)
LMTCB

## 2023-09-18 ENCOUNTER — Ambulatory Visit (HOSPITAL_BASED_OUTPATIENT_CLINIC_OR_DEPARTMENT_OTHER): Payer: BC Managed Care – PPO

## 2023-09-19 DIAGNOSIS — I341 Nonrheumatic mitral (valve) prolapse: Secondary | ICD-10-CM | POA: Diagnosis not present

## 2023-09-30 DIAGNOSIS — F17210 Nicotine dependence, cigarettes, uncomplicated: Secondary | ICD-10-CM | POA: Diagnosis not present

## 2023-10-08 ENCOUNTER — Encounter: Payer: Self-pay | Admitting: Emergency Medicine

## 2023-10-08 ENCOUNTER — Ambulatory Visit: Payer: BC Managed Care – PPO | Admitting: Emergency Medicine

## 2023-10-08 VITALS — BP 102/67 | HR 60 | Temp 98.0°F | Ht 65.0 in | Wt 122.4 lb

## 2023-10-08 DIAGNOSIS — F17200 Nicotine dependence, unspecified, uncomplicated: Secondary | ICD-10-CM

## 2023-10-08 DIAGNOSIS — J449 Chronic obstructive pulmonary disease, unspecified: Secondary | ICD-10-CM

## 2023-10-08 DIAGNOSIS — E785 Hyperlipidemia, unspecified: Secondary | ICD-10-CM | POA: Insufficient documentation

## 2023-10-08 HISTORY — DX: Hyperlipidemia, unspecified: E78.5

## 2023-10-08 NOTE — Progress Notes (Signed)
Subjective:    Patient ID: Michele Jacobs, female    DOB: 03/27/1961, 62 y.o.   MRN: [id number]  HPI  ROV 10/08/2023 -follow-up visit for 62 year old woman, former smoker with mild COPD.  I have seen her for this as well as a right lower lobe posterior opacity on CT chest that cleared on serial imaging.  She is not on any scheduled BD therapy.  She has albuterol which she uses rarely  In terms of respiratory symptoms, the patient occasionally experiences shortness of breath, typically in the morning or after using the treadmill. However, she maintains an active lifestyle, walking on the treadmill three times a day for fifteen minutes each time. The patient reported occasional coughing, but only requires her albuterol inhaler approximately once every couple of weeks.  The patient is up-to-date on her vaccinations, having received both the flu and COVID-19 vaccines this year, as well as the RSV vaccine last year. She reported feeling unwell after receiving the COVID-19 vaccine.   Lung cancer screening CT chest was done at [facility] on 09/30/2023, reviewed by me mild centrilobular emphysema with some biapical pleural thickening.  There is a noncalcified pleural plaque along the right upper lobe unchanged from prior.  No suspicious nodules.  RADS 1 study, needs repeat scan in 1 year     Review of Systems As per HPI  Past Medical History:  Diagnosis Date   Anxiety    Anxiety state 04/02/2013   Overview:  STORY: See history dated 01/19/2010`E1o3L`IMPRESSION: Continue medications as prescribed.  Relaxation techniques described.  Using Xanax prn.  May call for refills.  Using xanax 1/2 tablet qHS prn.  Last Assessment & Plan:  Worsening since her father became terminally ill. History of the same with similar presentation. 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  Repeat 2014`E1o3L`IMPRESSION: Continue calcium 1200mg  daily with vitamin D.   Ear pain, bilateral 08/22/2014   Last Assessment & Plan:  May be related to eustachian tube dysfunction. Advised antihistamine-decongestant and Flonase Daily for the next 10-14 days. If symptoms do not improve, or if they worsen, notify me.   Gastroesophageal reflux disease 08/19/2023   Increased frequency of urination 08/12/2012   Laryngopharyngeal reflux (LPR) 07/15/2017   Mitral valve prolapse    Muscle cramps 08/22/2014   Last Assessment & Plan:  Relative dehydration possibly. Labs ordered. Advised trial of magnesium supplement, 400-800 milligrams nightly over the next few weeks.   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  The goal for anyone who smokes is to decrease the amount smoked each day and then eventually quit.  Stop smoking groups, printed information and medications are all available for your use when you decide to stop. She ONLY smokes if she has had quite a few drinks. jmw     Family History  Problem Relation Age of Onset   Ovarian cancer Mother    Cancer Mother    Lymphoma Father    Heart disease Father    Cancer Father    Non-Hodgkin's lymphoma Father    Atrial fibrillation Father    Obesity Sister  Social History   Socioeconomic History   Marital status: Single    Spouse name: Not on file   Number of children: Not on file   Years of education: Not on file   Highest education level: Not on file  Occupational History   Occupation: bookkeeper  Tobacco Use   Smoking status: Former    Current packs/day: 0.00    Average packs/day: 0.5 packs/day for 30.0 years (15.0 ttl pk-yrs)    Types: Cigarettes    Start date: 37    Quit date: 2013    Years since quitting:  11.7   Smokeless tobacco: Never  Vaping Use   Vaping status: Never Used  Substance and Sexual Activity   Alcohol use: No   Drug use: No   Sexual activity: Never    Birth control/protection: Surgical  Other Topics Concern   Not on file  Social History Narrative   Lives alone;   No children   Bookkeeper   Social Determinants of Corporate investment banker Strain: Not on file  Food Insecurity: Not on file  Transportation Needs: Not on file  Physical Activity: Not on file  Stress: Not on file  Social Connections: Unknown (05/06/2022)   Received from [facility], [facility]   Social Network    Social Network: Not on file  Intimate Partner Violence: Unknown (03/28/2022)   Received from [facility], [facility]   HITS    Physically Hurt: Not on file    Insult or Talk Down To: Not on file    Threaten Physical Harm: Not on file    Scream or Curse: Not on file    [place]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 No inhaled exposures.   Allergies  Allergen Reactions   Codeine Nausea And Vomiting     Outpatient Medications Prior to Visit  Medication Sig Dispense Refill   ALPRAZolam (XANAX) 0.25 MG tablet TAKE 1 TABLET BY MOUTH DAILY AS NEEDED FOR ANXIETY 30 tablet 2   aspirin EC 81 MG tablet Take 1 tablet (81 mg total) by mouth daily. 90 tablet 3   cholecalciferol (VITAMIN D) 1000 units tablet Take 1,000 Units by mouth daily.     metoprolol succinate (TOPROL XL) 25 MG 24 hr tablet Take 1 tablet (25 mg total) by mouth daily. Take with or immediately following a meal 90 tablet 1   triamcinolone cream (KENALOG) 0.5 % Apply 1 Application topically 2 (two) times daily. Use as needed for skin rash 30 g 0   VENTOLIN HFA 108 (90 Base) MCG/ACT inhaler INHALE 2 PUFFS INTO THE LUNGS EVERY 6 HOURS AS NEEDED FOR WHEEZING OR FOR SHORTNESS OF BREATH 18 g 6   No facility-administered medications prior to visit.         Objective:   Physical Exam Vitals:   10/08/23 0920  BP: 102/67  Pulse:  60  Temp: 98 F (36.7 C)  TempSrc: Temporal  SpO2: 98%  Weight: 122 lb 6.4 oz (55.5 kg)  Height: 5\' 5"  (1.651 m)   Gen: Pleasant, well-nourished, in no distress,  normal affect  ENT: No lesions,  mouth clear,  oropharynx clear, no postnasal drip  Neck: No JVD, no stridor  Lungs: No use of accessory muscles, no crackles or wheezing on normal respiration, no wheeze on forced expiration  Cardiovascular: RRR, heart sounds normal, no murmur or gallops, no peripheral edema  Musculoskeletal: No deformities, no cyanosis or clubbing  Neuro: alert, awake, non focal  Skin: Warm, no lesions or rash  Assessment & Plan:  COPD, mild (HCC)  COPD Occasional shortness of breath, mostly post-exercise, infrequent use of albuterol. -Continue current management, keep albuterol available for use as needed.  General Health Maintenance Up to date on vaccinations including flu and COVID-19. -Continue current vaccination schedule.  Tobacco use disorder Lung Nodule Surveillance Recent CT scan shows no new nodules, stable scarring in right upper lobe likely secondary to past pneumonia. -Repeat screening CT scan in 1 year.   Hyperlipidemia Coronary Artery Disease Mild coronary calcifications noted on recent CT scan. -Communicate findings to cardiologist (Dr. Jerral Ralph) for further management. May want to start statin or work up further.    Levy Pupa, MD, PhD 10/08/2023, 9:52 AM  Pulmonary and Critical Care [phone] or if no answer [phone]

## 2023-10-08 NOTE — Assessment & Plan Note (Signed)
  COPD Occasional shortness of breath, mostly post-exercise, infrequent use of albuterol. -Continue current management, keep albuterol available for use as needed.  General Health Maintenance Up to date on vaccinations including flu and COVID-19. -Continue current vaccination schedule.

## 2023-10-08 NOTE — Assessment & Plan Note (Signed)
Lung Nodule Surveillance Recent CT scan shows no new nodules, stable scarring in right upper lobe likely secondary to past pneumonia. -Repeat screening CT scan in 1 year.

## 2023-10-08 NOTE — Assessment & Plan Note (Signed)
Coronary Artery Disease Mild coronary calcifications noted on recent CT scan. -Communicate findings to cardiologist (Dr. Jerral Ralph) for further management. May want to start statin or work up further.

## 2023-10-08 NOTE — Patient Instructions (Signed)
VISIT SUMMARY:  During your recent visit, we discussed your lung health, heart health, and asthma management. Your CT scan showed no new nodules and stable scarring in your right upper lobe, likely due to past pneumonia. We also noted mild coronary calcifications on your scan, which we will communicate to your cardiologist. You reported occasional shortness of breath, mostly after exercise, and infrequent use of your albuterol inhaler. You are up-to-date on your vaccinations.  YOUR PLAN:  -LUNG NODULE SURVEILLANCE: Your recent CT scan showed no new growths in your lungs, and the scarring in your right upper lobe is stable. This scarring is likely due to a past pneumonia infection.  -CORONARY ARTERY DISEASE: Your CT scan showed mild calcifications in your coronary arteries, which are the blood vessels that supply your heart. We will share these findings with your cardiologist for further management.  -MlLD COPD: You reported occasional shortness of breath, mostly after exercise, and you use your albuterol inhaler infrequently. Continue managing your asthma as you have been, and keep your albuterol inhaler available for use as needed.  -GENERAL HEALTH MAINTENANCE: You are up-to-date on your vaccinations, including the flu and COVID-19 vaccines. Continue following your current vaccination schedule.  INSTRUCTIONS:  Please continue with your current asthma management and keep your albuterol inhaler available for use as needed. We will schedule a repeat CT scan in 1 year to continue monitoring your lung health. We will also communicate the findings of mild coronary calcifications to your cardiologist for further management. Continue following your current vaccination schedule.

## 2023-11-05 ENCOUNTER — Other Ambulatory Visit: Payer: Self-pay | Admitting: Family Medicine

## 2023-11-05 DIAGNOSIS — R002 Palpitations: Secondary | ICD-10-CM

## 2023-12-10 DIAGNOSIS — Z1151 Encounter for screening for human papillomavirus (HPV): Secondary | ICD-10-CM | POA: Diagnosis not present

## 2023-12-10 DIAGNOSIS — Z6820 Body mass index (BMI) 20.0-20.9, adult: Secondary | ICD-10-CM | POA: Diagnosis not present

## 2023-12-10 DIAGNOSIS — Z1231 Encounter for screening mammogram for malignant neoplasm of breast: Secondary | ICD-10-CM | POA: Diagnosis not present

## 2023-12-10 DIAGNOSIS — Z13820 Encounter for screening for osteoporosis: Secondary | ICD-10-CM | POA: Diagnosis not present

## 2023-12-10 DIAGNOSIS — Z01419 Encounter for gynecological examination (general) (routine) without abnormal findings: Secondary | ICD-10-CM | POA: Diagnosis not present

## 2023-12-10 DIAGNOSIS — Z124 Encounter for screening for malignant neoplasm of cervix: Secondary | ICD-10-CM | POA: Diagnosis not present

## 2023-12-10 LAB — HM MAMMOGRAPHY

## 2023-12-10 LAB — HM DEXA SCAN

## 2023-12-25 ENCOUNTER — Encounter: Payer: Self-pay | Admitting: Family Medicine

## 2023-12-25 ENCOUNTER — Ambulatory Visit: Payer: BC Managed Care – PPO | Admitting: Family Medicine

## 2023-12-25 VITALS — BP 120/62 | HR 62 | Temp 97.8°F | Resp 18 | Ht 66.0 in | Wt 120.2 lb

## 2023-12-25 DIAGNOSIS — R0602 Shortness of breath: Secondary | ICD-10-CM | POA: Diagnosis not present

## 2023-12-25 DIAGNOSIS — R0989 Other specified symptoms and signs involving the circulatory and respiratory systems: Secondary | ICD-10-CM | POA: Diagnosis not present

## 2023-12-25 LAB — D-DIMER, QUANTITATIVE: D-Dimer, Quant: 0.38 ug{FEU}/mL (ref <0.50)

## 2023-12-25 LAB — CBC
HCT: 38 % (ref 36.0–46.0)
Hemoglobin: 12.7 g/dL (ref 12.0–15.0)
MCHC: 33.5 g/dL (ref 30.0–36.0)
MCV: 95.6 fL (ref 78.0–100.0)
Platelets: 203 10*3/uL (ref 150.0–400.0)
RBC: 3.97 Mil/uL (ref 3.87–5.11)
RDW: 13.3 % (ref 11.5–15.5)
WBC: 4.7 10*3/uL (ref 4.0–10.5)

## 2023-12-25 LAB — BASIC METABOLIC PANEL
BUN: 18 mg/dL (ref 6–23)
CO2: 28 meq/L (ref 19–32)
Calcium: 9.3 mg/dL (ref 8.4–10.5)
Chloride: 105 meq/L (ref 96–112)
Creatinine, Ser: 0.85 mg/dL (ref 0.40–1.20)
GFR: 73.97 mL/min (ref >60.00)
Glucose, Bld: 92 mg/dL (ref 70–99)
Potassium: 4.6 meq/L (ref 3.5–5.1)
Sodium: 140 meq/L (ref 135–145)

## 2023-12-25 LAB — TROPONIN I (HIGH SENSITIVITY): High Sens Troponin I: 4 ng/L (ref 2–17)

## 2023-12-25 MED ORDER — PREDNISONE 20 MG PO TABS
ORAL_TABLET | ORAL | 0 refills | Status: DC
Start: 2023-12-25 — End: 2024-05-24

## 2023-12-25 MED ORDER — DOXYCYCLINE HYCLATE 100 MG PO CAPS
100.0000 mg | ORAL_CAPSULE | Freq: Two times a day (BID) | ORAL | 0 refills | Status: DC
Start: 2023-12-25 — End: 2024-05-24

## 2023-12-25 NOTE — Progress Notes (Signed)
 [facility] [address] [place], [place] [address] [phone]  Date:  12/25/2023   Name:  Michele Jacobs   DOB:  11-11-1961   MRN:  [id number]  PCP:  Watt Harlene BROCKS, MD    Chief Complaint: URI (Shortness of breath. Chest pain Dry cough, fatigue x 1 week. Negative covid test. )   History of Present Illness:  Michele Jacobs is a 63 y.o. very pleasant female patient who presents with the following:  Pt seen today with concern of SOB -  History of osteopenia, MVP, reflux, anxiety, palpitations treated with toprol  xl, COPD  I saw her most recently for a CPE in June of last year/ 2024 Seen by Dr Shelah in October: COPD Occasional shortness of breath, mostly post-exercise, infrequent use of albuterol . -Continue current management, keep albuterol  available for use as needed. General Health Maintenance Up to date on vaccinations including flu and COVID-19. -Continue current vaccination schedule. Tobacco use disorder Lung Nodule Surveillance Recent CT scan shows no new nodules, stable scarring in right upper lobe likely secondary to past pneumonia. -Repeat screening CT scan in 1 year. Hyperlipidemia Coronary Artery Disease Mild coronary calcifications noted on recent CT scan. -Communicate findings to cardiologist (Dr. Mona) for further management. May want to start statin or work up further.   She has felt SOB for 10-14 days.  As above, history of COPD She notes it hurts to take a deep breath and also notes a dry cough She notes a pain in the right posterior back when she takes a deep breath She feels like there may be fluid in her ears She started on some old amox that she found and took 3 doses of 500- felt a little better but then she used of all the medicine and felt worse again No fever No GI symptoms She took a covid test just this morning, negative  Lab Results  Component Value Date   HGBA1C 5.2  05/26/2023     Patient Active Problem List   Diagnosis Date Noted   Hyperlipidemia 10/08/2023   Gastroesophageal reflux disease 08/19/2023   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   Anxiety    COPD, mild (HCC) 09/07/2020   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  Relaxation techniques described.  Using Xanax  prn.  May call for refills.  Using xanax  1/2 tablet qHS prn.  Last Assessment & Plan:  Worsening since her father became terminally ill. History of the same with similar presentation. 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  Repeat 2014`E1o3L`IMPRESSION: Continue calcium  1200mg  daily with vitamin D .   Ear pain, bilateral 08/22/2014   Last Assessment & Plan:  May be related to eustachian tube dysfunction.  Advised antihistamine-decongestant and Flonase Daily for the next 10-14 days. If symptoms do not improve, or if they worsen, notify me.   Gastroesophageal reflux disease 08/19/2023   Increased frequency of urination 08/12/2012   Laryngopharyngeal reflux (LPR) 07/15/2017   Mitral valve prolapse    Muscle cramps 08/22/2014   Last Assessment & Plan:  Relative dehydration possibly. Labs ordered. Advised trial of magnesium supplement, 400-800 milligrams nightly over the next  few weeks.   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  The goal for anyone who smokes is to decrease the amount smoked each day and then eventually quit.  Stop smoking groups, printed information and medications are all available for your use when you decide to stop. She ONLY smokes if she has had quite a few drinks. jmw    Past Surgical History:  Procedure Laterality Date   THYROGLOSSAL DUCT CYST  01/2018   removal   TUBAL LIGATION      Social History   Tobacco Use   Smoking status: Former    Current packs/day: 0.00    Average packs/day: 0.5 packs/day for 30.0 years (15.0 ttl pk-yrs)    Types: Cigarettes    Start date: 68    Quit date: 2013    Years since quitting: 12.0   Smokeless tobacco: Never  Vaping Use   Vaping status: Never Used  Substance Use Topics   Alcohol use: No   Drug use: No    Family History  Problem Relation Age of Onset   Ovarian cancer Mother    Cancer Mother    Lymphoma Father    Heart disease Father    Cancer Father    Non-Hodgkin's lymphoma Father    Atrial fibrillation Father    Obesity Sister     Allergies  Allergen Reactions   Codeine Nausea And Vomiting    Medication list has been reviewed and updated.  Current Outpatient Medications on File Prior to Visit  Medication Sig Dispense Refill   ALPRAZolam  (XANAX ) 0.25 MG tablet TAKE 1 TABLET BY MOUTH DAILY AS NEEDED FOR ANXIETY 30 tablet 2   aspirin  EC 81 MG tablet Take 1 tablet (81 mg total) by mouth daily. 90 tablet 3   cholecalciferol (VITAMIN D ) 1000 units tablet Take 1,000 Units by mouth daily.     TOPROL  XL 25 MG 24 hr tablet TAKE 1 TABLET BY MOUTH DAILY WITH OR IMMEDIATELY FOLLOWING A MEAL 90 tablet 1   triamcinolone  cream (KENALOG ) 0.5 % Apply 1 Application topically 2 (two) times daily. Use as  needed for skin rash 30 g 0   VENTOLIN  HFA 108 (90 Base) MCG/ACT inhaler INHALE 2 PUFFS INTO THE LUNGS EVERY 6 HOURS AS NEEDED FOR WHEEZING OR FOR SHORTNESS OF BREATH 18 g 6   No current facility-administered medications on file prior to visit.    Review of Systems:  As per HPI- otherwise negative.   Physical Examination: Vitals:   12/25/23 0937  BP: 120/62  Pulse: 62  Resp: 18  Temp: 97.8 F (36.6 C)  SpO2: 97%   Vitals:   12/25/23 0937  Weight: 120 lb 3.2 oz (54.5 kg)  Height: 5' 6 (1.676 m)   Body mass index is 19.4 kg/m. Ideal Body Weight: Weight in (lb) to have BMI = 25: 154.6  GEN: no acute distress.  Normal weight, looks well HEENT: Atraumatic, Normocephalic. Bilateral TM wnl, oropharynx normal.  PEERL,EOMI.    Ears and Nose: No external deformity. CV: RRR, No M/G/R. No JVD. No thrill. No extra heart sounds. PULM: CTA B, no wheezes, crackles, rhonchi. No retractions. No resp. distress. No accessory muscle use. ABD: S, NT, ND, +BS. No rebound. No HSM. EXTR: No c/c/e PSYCH: Normally interactive. Conversant.   EKG: sinus brady with rate of 56, RSR' V1- compared with EKG from this past August EKG appears more normal than previous, no concerning acute findings  Assessment and Plan: Chest congestion - Plan: CBC, Basic metabolic panel, predniSONE  (DELTASONE ) 20 MG tablet, doxycycline  (VIBRAMYCIN ) 100 MG capsule, DG Chest 2 View, CANCELED: POCT Influenza A/B  SOB (shortness of breath) - Plan: EKG 12-Lead, D-Dimer, Quantitative, Troponin I (High Sensitivity), DG Chest 2 View, CANCELED: DG Chest 2 View, CANCELED: DG Chest 2 View  Patient seen today with concern of shortness of breath, likely due to COPD and viral illness.  Treat with doxycycline  and steroids however, given her symptoms I will also obtain a troponin and D-dimer, and a chest x-ray Signed Harlene Schroeder, MD  I received her D-dimer and troponin, both normal.  Let her know Results for orders placed or  performed in visit on 12/25/23  CBC   Collection Time: 12/25/23 10:17 AM  Result Value Ref Range   WBC 4.7 4.0 - 10.5 K/uL   RBC 3.97 3.87 - 5.11 Mil/uL   Platelets 203.0 150.0 - 400.0 K/uL   Hemoglobin 12.7 12.0 - 15.0 g/dL   HCT 61.9 63.9 - 53.9 %   MCV 95.6 78.0 - 100.0 fl   MCHC 33.5 30.0 - 36.0 g/dL   RDW 86.6 88.4 - 84.4 %  Basic metabolic panel   Collection Time: 12/25/23 10:17 AM  Result Value Ref Range   Sodium 140 135 - 145 mEq/L   Potassium 4.6 3.5 - 5.1 mEq/L   Chloride 105 96 - 112 mEq/L   CO2 28 19 - 32 mEq/L   Glucose, Bld 92 70 - 99 mg/dL   BUN 18 6 - 23 mg/dL   Creatinine, Ser 9.14 0.40 - 1.20 mg/dL   GFR 26.02 >39.99 mL/min   Calcium  9.3 8.4 - 10.5 mg/dL  D-Dimer, Quantitative   Collection Time: 12/25/23 10:17 AM  Result Value Ref Range   D-Dimer, Quant 0.38 <0.50 mcg/mL FEU  Troponin I (High Sensitivity)   Collection Time: 12/25/23 10:17 AM  Result Value Ref Range   High Sens Troponin I 4 2 - 17 ng/L   Pt reached out to me with her CXR from [facility] that came to her mychart account-  it was normal  Await my copy of report  She will keep me abreast of her progress

## 2023-12-25 NOTE — Patient Instructions (Signed)
 Please stop by lab and then x-ray on the ground floor- I will be in touch with your results asap In the meantime we will start you on antibiotics and steroids

## 2024-05-04 ENCOUNTER — Other Ambulatory Visit: Payer: Self-pay | Admitting: Family Medicine

## 2024-05-04 DIAGNOSIS — R002 Palpitations: Secondary | ICD-10-CM

## 2024-05-24 NOTE — Patient Instructions (Incomplete)
 It was great to see you today, I will be in touch with your lab work

## 2024-05-24 NOTE — Progress Notes (Unsigned)
 [facility] [address] [place], [place] [address] [phone]  Date:  05/26/2024   Name:  Michele Jacobs   DOB:  10/22/1961   MRN:  [id number]  PCP:  Kaylee Partridge, MD    Chief Complaint: No chief complaint on file.   History of Present Illness:  Michele Jacobs is a 63 y.o. very pleasant female patient who presents with the following:  Patient seen today for physical exam.  Most recent visit with myself was in January for sick visit History of osteopenia, MVP, reflux, anxiety, palpitations treated with toprol  xl.,  COPD followed by Dr. Baldwin Levee Most recent visit with Dr. Baldwin Levee was in [place] also using albuterol  just as needed She is doing periodic lung nodule surveillance scans-most recent scan on chart was done at [facility], October 2024.  This looked okay, but they recommended 1 year recheck  Her gynecologist is Dr. 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   Anxiety    COPD, 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  Relaxation techniques described.  Using Xanax  prn.  May call for refills.  Using xanax  1/2 tablet qHS prn.  Last Assessment & Plan:  Worsening since her father became terminally ill. History of the same with similar presentation. 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  Repeat 2014`E1o3L`IMPRESSION: Continue calcium 1200mg  daily with vitamin D .   Ear pain, bilateral 08/22/2014   Last Assessment & Plan:  May be related to eustachian tube dysfunction. Advised antihistamine-decongestant and Flonase Daily for the next 10-14 days. If symptoms do not improve, or if they worsen, notify me.   Gastroesophageal reflux disease 08/19/2023   Increased frequency of urination 08/12/2012   Laryngopharyngeal reflux (LPR) 07/15/2017   Mitral valve prolapse    Muscle cramps 08/22/2014   Last Assessment & Plan:  Relative dehydration possibly. Labs ordered. Advised trial of magnesium supplement, 400-800 milligrams nightly over the next few weeks.   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  The goal for anyone who smokes is to decrease the amount smoked each day and then eventually quit.  Stop smoking groups, printed information and medications are all available for your use when you decide to stop. She ONLY smokes if she has had quite a few drinks. jmw    Past Surgical History:  Procedure Laterality Date   THYROGLOSSAL DUCT CYST  01/2018    removal   TUBAL LIGATION      Social History   Tobacco Use   Smoking status: Former    Current packs/day: 0.00    Average packs/day: 0.5 packs/day for 30.0 years (15.0 ttl pk-yrs)    Types: Cigarettes    Start date: 20    Quit date: 2013    Years since quitting: 12.4   Smokeless tobacco: Never  Vaping Use   Vaping status: Never Used  Substance Use Topics   Alcohol use: No   Drug use: No    Family History  Problem Relation Age of Onset   Ovarian cancer Mother    Cancer Mother    Lymphoma Father    Heart disease Father    Cancer Father    Non-Hodgkin's lymphoma Father    Atrial fibrillation Father    Obesity Sister     Allergies  Allergen Reactions   Codeine Nausea And Vomiting    Medication list has been reviewed and updated.  Current Outpatient Medications on File Prior to Visit  Medication Sig Dispense Refill   ALPRAZolam  (XANAX ) 0.25 MG tablet TAKE 1 TABLET BY MOUTH DAILY AS NEEDED FOR ANXIETY 30 tablet 2   aspirin  EC 81 MG tablet Take 1 tablet (81 mg total) by mouth daily. 90 tablet 3   cholecalciferol (VITAMIN D ) 1000 units tablet Take 1,000 Units by mouth daily.     doxycycline  (VIBRAMYCIN ) 100 MG capsule Take 1 capsule (100 mg total) by mouth 2 (two) times daily. 20 capsule 0   metoprolol  succinate (TOPROL  XL) 25 MG 24 hr tablet Take 1 tablet (25 mg total) by mouth daily. Take with or immediately following a meal 90 tablet 1   predniSONE  (DELTASONE ) 20 MG tablet Take 40 mg by mouth daily for 3 days, then 20 mg by mouth daily for 3 days 9 tablet 0   triamcinolone  cream (KENALOG ) 0.5 % Apply 1 Application topically 2 (two) times daily. Use as needed for skin rash 30 g 0   VENTOLIN  HFA 108 (90 Base) MCG/ACT inhaler INHALE 2 PUFFS INTO THE LUNGS EVERY 6 HOURS AS NEEDED FOR WHEEZING OR FOR SHORTNESS OF BREATH 18 g 6   No current facility-administered medications on file prior to visit.    Review of Systems:  As per HPI- otherwise negative.   Physical  Examination: There were no vitals filed for this visit. There were no vitals filed for this visit. There is no height or weight on file to calculate BMI. Ideal Body Weight:    GEN: no acute distress. HEENT: Atraumatic, Normocephalic.  Ears and Nose: No external deformity. CV: RRR, No M/G/R. No JVD. No thrill. No extra heart sounds. PULM: CTA B, no wheezes, crackles, rhonchi. No retractions. No resp. distress. No accessory muscle use. ABD: S, NT, ND, +BS. No rebound. No HSM. EXTR: No c/c/e PSYCH: Normally interactive. Conversant.    Assessment and Plan: *** Physical exam today.  Encouraged healthy diet and exercise routine Will plan further follow- up pending labs.  Signed Gates Kasal, MD

## 2024-05-26 ENCOUNTER — Encounter: Payer: Self-pay | Admitting: Family Medicine

## 2024-05-26 ENCOUNTER — Ambulatory Visit (INDEPENDENT_AMBULATORY_CARE_PROVIDER_SITE_OTHER): Payer: BC Managed Care – PPO | Admitting: Family Medicine

## 2024-05-26 VITALS — BP 108/74 | HR 55 | Temp 97.9°F | Ht 66.0 in | Wt 119.6 lb

## 2024-05-26 DIAGNOSIS — J449 Chronic obstructive pulmonary disease, unspecified: Secondary | ICD-10-CM

## 2024-05-26 DIAGNOSIS — Z0000 Encounter for general adult medical examination without abnormal findings: Secondary | ICD-10-CM

## 2024-05-26 DIAGNOSIS — E559 Vitamin D deficiency, unspecified: Secondary | ICD-10-CM | POA: Diagnosis not present

## 2024-05-26 DIAGNOSIS — K219 Gastro-esophageal reflux disease without esophagitis: Secondary | ICD-10-CM

## 2024-05-26 DIAGNOSIS — Z0001 Encounter for general adult medical examination with abnormal findings: Secondary | ICD-10-CM

## 2024-05-26 DIAGNOSIS — Z23 Encounter for immunization: Secondary | ICD-10-CM

## 2024-05-26 DIAGNOSIS — F411 Generalized anxiety disorder: Secondary | ICD-10-CM

## 2024-05-26 DIAGNOSIS — Z13220 Encounter for screening for lipoid disorders: Secondary | ICD-10-CM

## 2024-05-26 DIAGNOSIS — Z131 Encounter for screening for diabetes mellitus: Secondary | ICD-10-CM | POA: Diagnosis not present

## 2024-05-26 DIAGNOSIS — Z1329 Encounter for screening for other suspected endocrine disorder: Secondary | ICD-10-CM

## 2024-05-26 DIAGNOSIS — R002 Palpitations: Secondary | ICD-10-CM | POA: Diagnosis not present

## 2024-05-26 DIAGNOSIS — Z130 Encounter for screening for diseases of the blood and blood-forming organs and certain disorders involving the immune mechanism: Secondary | ICD-10-CM

## 2024-05-26 DIAGNOSIS — R0602 Shortness of breath: Secondary | ICD-10-CM

## 2024-05-26 LAB — CBC
HCT: 37.7 % (ref 36.0–46.0)
Hemoglobin: 12.8 g/dL (ref 12.0–15.0)
MCHC: 33.9 g/dL (ref 30.0–36.0)
MCV: 92.8 fl (ref 78.0–100.0)
Platelets: 205 10*3/uL (ref 150.0–400.0)
RBC: 4.07 Mil/uL (ref 3.87–5.11)
RDW: 13.6 % (ref 11.5–15.5)
WBC: 4.9 10*3/uL (ref 4.0–10.5)

## 2024-05-26 LAB — TSH: TSH: 1.27 u[IU]/mL (ref 0.35–5.50)

## 2024-05-26 LAB — COMPREHENSIVE METABOLIC PANEL WITH GFR
ALT: 15 U/L (ref 0–35)
AST: 18 U/L (ref 0–37)
Albumin: 4.2 g/dL (ref 3.5–5.2)
Alkaline Phosphatase: 71 U/L (ref 39–117)
BUN: 17 mg/dL (ref 6–23)
CO2: 32 meq/L (ref 19–32)
Calcium: 9.6 mg/dL (ref 8.4–10.5)
Chloride: 105 meq/L (ref 96–112)
Creatinine, Ser: 0.71 mg/dL (ref 0.40–1.20)
GFR: 91.53 mL/min (ref >60.00)
Glucose, Bld: 91 mg/dL (ref 70–99)
Potassium: 4.8 meq/L (ref 3.5–5.1)
Sodium: 141 meq/L (ref 135–145)
Total Bilirubin: 1.1 mg/dL (ref 0.2–1.2)
Total Protein: 6.2 g/dL (ref 6.0–8.3)

## 2024-05-26 LAB — VITAMIN D 25 HYDROXY (VIT D DEFICIENCY, FRACTURES): VITD: 37.5 ng/mL (ref 30.00–100.00)

## 2024-05-26 LAB — LIPID PANEL
Cholesterol: 184 mg/dL (ref 0–200)
HDL: 81.7 mg/dL (ref >39.00)
LDL Cholesterol: 89 mg/dL (ref 0–99)
NonHDL: 102.47
Total CHOL/HDL Ratio: 2
Triglycerides: 66 mg/dL (ref 0.0–149.0)
VLDL: 13.2 mg/dL (ref 0.0–40.0)

## 2024-05-26 LAB — HEMOGLOBIN A1C: Hgb A1c MFr Bld: 5.4 % (ref 4.6–6.5)

## 2024-05-26 MED ORDER — ALPRAZOLAM 0.25 MG PO TABS: ORAL_TABLET | ORAL | 2 refills | Status: AC

## 2024-05-26 MED ORDER — ALBUTEROL SULFATE HFA 108 (90 BASE) MCG/ACT IN AERS
1.0000 | INHALATION_SPRAY | Freq: Four times a day (QID) | RESPIRATORY_TRACT | 6 refills | Status: AC | PRN
Start: 2024-05-26 — End: ?

## 2024-05-26 MED ORDER — PANTOPRAZOLE SODIUM 40 MG PO TBEC: 40.0000 mg | DELAYED_RELEASE_TABLET | Freq: Every day | ORAL | 3 refills | Status: AC

## 2024-05-26 MED ORDER — QVAR REDIHALER 40 MCG/ACT IN AERB: 2.0000 | INHALATION_SPRAY | Freq: Two times a day (BID) | RESPIRATORY_TRACT | 6 refills | Status: DC

## 2024-05-27 ENCOUNTER — Ambulatory Visit (HOSPITAL_BASED_OUTPATIENT_CLINIC_OR_DEPARTMENT_OTHER)
Admission: RE | Admit: 2024-05-27 | Discharge: 2024-05-27 | Disposition: A | Discharge status: Home / Self Care | Payer: Self-pay | Source: Ambulatory Visit | Attending: Family Medicine | Admitting: Family Medicine

## 2024-05-27 DIAGNOSIS — R0602 Shortness of breath: Secondary | ICD-10-CM | POA: Insufficient documentation

## 2024-05-28 ENCOUNTER — Encounter: Payer: Self-pay | Admitting: Family Medicine

## 2024-05-28 DIAGNOSIS — R931 Abnormal findings on diagnostic imaging of heart and coronary circulation: Secondary | ICD-10-CM

## 2024-05-29 MED ORDER — ROSUVASTATIN CALCIUM 10 MG PO TABS: 10.0000 mg | ORAL_TABLET | Freq: Every day | ORAL | 3 refills | Status: AC

## 2024-06-14 DIAGNOSIS — K219 Gastro-esophageal reflux disease without esophagitis: Secondary | ICD-10-CM | POA: Diagnosis not present

## 2024-06-14 DIAGNOSIS — H6992 Unspecified Eustachian tube disorder, left ear: Secondary | ICD-10-CM | POA: Diagnosis not present

## 2024-06-16 ENCOUNTER — Encounter: Payer: Self-pay | Admitting: Family Medicine

## 2024-06-16 DIAGNOSIS — K219 Gastro-esophageal reflux disease without esophagitis: Secondary | ICD-10-CM

## 2024-06-17 DIAGNOSIS — J449 Chronic obstructive pulmonary disease, unspecified: Secondary | ICD-10-CM | POA: Diagnosis not present

## 2024-06-17 DIAGNOSIS — K219 Gastro-esophageal reflux disease without esophagitis: Secondary | ICD-10-CM | POA: Diagnosis not present

## 2024-07-22 ENCOUNTER — Telehealth: Payer: Self-pay

## 2024-07-22 NOTE — Telephone Encounter (Signed)
   Pre-operative Risk Assessment    Patient Name: Michele Jacobs  DOB: [birth year] MRN: [id number]   Date of last office visit: 08/21/23 Date of next office visit: 08/12/24   Request for Surgical Clearance    Procedure:  EGD  Date of Surgery:  Clearance 08/30/24                                Surgeon:  Dr. Kristie Socks Group or Practice Name:  [facility], [place] Phone number:  [phone] Fax number:  [phone]   Type of Clearance Requested:   - Medical    Type of Anesthesia:  propofol   Additional requests/questions:    SignedAnnabella LITTIE Sayres   07/22/2024, 10:54 AM

## 2024-07-22 NOTE — Telephone Encounter (Signed)
 I will update all parties of appt with Dr. Edwyna 08/18/24. Per preop APP Josefa Beauvais, FNP defer clearance to MD appt.

## 2024-07-22 NOTE — Telephone Encounter (Signed)
 Preoperative team, patient has upcoming cardiology appointment with Dr. Edwyna.  Please add preoperative cardiac evaluation to appointment notes.  I will defer cardiac evaluation to upcoming appointment.  Thank you for your help.  Josefa HERO. Tyee Vandevoorde NP-C     07/22/2024, 11:27 AM [facility] [address] Office [phone] Fax [phone]

## 2024-08-18 ENCOUNTER — Encounter: Payer: Self-pay | Admitting: Cardiology

## 2024-08-18 ENCOUNTER — Ambulatory Visit: Attending: Cardiology | Admitting: Cardiology

## 2024-08-18 VITALS — BP 90/50 | HR 64 | Ht 65.0 in | Wt 118.0 lb

## 2024-08-18 DIAGNOSIS — R931 Abnormal findings on diagnostic imaging of heart and coronary circulation: Secondary | ICD-10-CM | POA: Diagnosis not present

## 2024-08-18 DIAGNOSIS — E782 Mixed hyperlipidemia: Secondary | ICD-10-CM

## 2024-08-18 DIAGNOSIS — Z01810 Encounter for preprocedural cardiovascular examination: Secondary | ICD-10-CM | POA: Diagnosis not present

## 2024-08-18 DIAGNOSIS — R002 Palpitations: Secondary | ICD-10-CM | POA: Diagnosis not present

## 2024-08-18 MED ORDER — METOPROLOL SUCCINATE ER 25 MG PO TB24: 12.5000 mg | ORAL_TABLET | Freq: Every day | ORAL | 3 refills | Status: DC

## 2024-08-18 NOTE — Progress Notes (Signed)
 Cardiology Office Note:    Date:  08/18/2024   ID:  Michele Jacobs, DOB May 20, 1961, MRN [id number]  PCP:  Michele Harlene BROCKS, MD  Cardiologist:  Jennifer JONELLE Crape, MD   Referring MD: Michele Harlene BROCKS, MD    ASSESSMENT:    1. Preop cardiovascular exam   2. Elevated coronary artery calcium  score   3. Palpitations   4. Mixed hyperlipidemia   5. Heart palpitations    PLAN:    In order of problems listed above:  Primary prevention stressed with the patient.  Importance of compliance with diet medication stressed and patient verbalized standing. Elevated calcium  score: She is taking good care of herself with exercise. Mixed dyslipidemia: Lipids are not at goal.  She is not keen on increasing her medications.  Diet emphasized.  Goal LDL less than 60. Palpitations: These have resolved.  Blood pressure is borderline so asked her to cut her beta-blocker to half dose and see how she feels.  She will experiment with this and let us  know and keep us  posted.  I am worried about orthostatic hypotension issues. Ex-smoker: Promises never to go back to smoking.  Counseled about this. Patient will be seen in follow-up appointment in 6 months or earlier if the patient has any concerns.    Medication Adjustments/Labs and Tests Ordered: Current medicines are reviewed at length with the patient today.  Concerns regarding medicines are outlined above.  Orders Placed This Encounter  Procedures   EKG 12-Lead   No orders of the defined types were placed in this encounter.    No chief complaint on file.    History of Present Illness:    Michele Jacobs is a 63 y.o. female.  Patient has past medical history of elevated calcium  score, mixed dyslipidemia.  She has history of smoking in the past but quit many years ago.  She denies any chest pain orthopnea or PND.  She exercises on a regular basis.  At the time of my evaluation, the patient is alert awake oriented and in no distress.  Past Medical  History:  Diagnosis Date   Anxiety    Anxiety state 04/02/2013   Overview:  STORY: See history dated 01/19/2010`E1o3L`IMPRESSION: Continue medications as prescribed.  Relaxation techniques described.  Using Xanax  prn.  May call for refills.  Using xanax  1/2 tablet qHS prn.  Last Assessment & Plan:  Worsening since her father became terminally ill. History of the same with similar presentation. 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  Repeat 2014`E1o3L`IMPRESSION: Continue calcium  1200mg  daily with vitamin D .   Ear pain, bilateral 08/22/2014   Last Assessment & Plan:  May be related to eustachian tube dysfunction. Advised antihistamine-decongestant and Flonase Daily for the next 10-14 days. If symptoms do not improve, or if they worsen, notify me.   Gastroesophageal reflux disease 08/19/2023   Hyperlipidemia 10/08/2023   Increased frequency of urination 08/12/2012   Laryngopharyngeal reflux (LPR) 07/15/2017   Mitral valve prolapse    Muscle cramps 08/22/2014   Last Assessment & Plan:  Relative dehydration possibly. Labs ordered. Advised trial of magnesium supplement, 400-800 milligrams nightly over the next few weeks.   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  The goal for anyone who smokes is to decrease the amount smoked each day and then eventually quit.  Stop smoking groups, printed information and medications are all available for your use when you decide to stop. She ONLY smokes if she has had quite a few drinks. jmw    Past Surgical History:  Procedure Laterality Date   THYROGLOSSAL DUCT CYST  01/2018   removal   TUBAL LIGATION       Current Medications: Current Meds  Medication Sig   albuterol  (VENTOLIN  HFA) 108 (90 Base) MCG/ACT inhaler Inhale 1-2 puffs into the lungs every 6 (six) hours as needed for wheezing or shortness of breath.   ALPRAZolam  (XANAX ) 0.25 MG tablet TAKE 1 TABLET BY MOUTH DAILY AS NEEDED FOR ANXIETY   aspirin  EC 81 MG tablet Take 1 tablet (81 mg total) by mouth daily.   beclomethasone (QVAR  REDIHALER) 40 MCG/ACT inhaler Inhale 2 puffs into the lungs 2 (two) times daily.   cholecalciferol (VITAMIN D ) 1000 units tablet Take 1,000 Units by mouth daily.   metoprolol  succinate (TOPROL  XL) 25 MG 24 hr tablet Take 1 tablet (25 mg total) by mouth daily. Take with or immediately following a meal   pantoprazole  (PROTONIX ) 40 MG tablet Take 1 tablet (40 mg total) by mouth daily.   rosuvastatin  (CRESTOR ) 10 MG tablet Take 1 tablet (10 mg total) by mouth daily.   triamcinolone  cream (KENALOG ) 0.5 % Apply 1 Application topically 2 (two) times daily. Use as needed for skin rash     Allergies:   Codeine   Social History   Socioeconomic History   Marital status: Single    Spouse name: Not on file   Number of children: Not on file   Years of education: Not on file   Highest education level: Associate degree: occupational, Scientist, product/process development, or vocational program  Occupational History   Occupation: bookkeeper  Tobacco Use   Smoking status: Former    Current packs/day: 0.00    Average packs/day: 0.5 packs/day for 30.0 years (15.0 ttl pk-yrs)    Types: Cigarettes    Start date: 42    Quit date: 2013    Years since quitting: 12.6   Smokeless tobacco: Never  Vaping Use   Vaping status: Never Used  Substance and Sexual Activity   Alcohol use: No   Drug use: No   Sexual activity: Never    Birth control/protection: Surgical  Other Topics Concern   Not on file  Social History Narrative   Lives alone;   No children   Bookkeeper   Social Drivers of Corporate investment banker Strain: Low Risk   (12/25/2023)   Overall Financial Resource Strain (CARDIA)    Difficulty of Paying Living Expenses: Not very hard  Food Insecurity: No Food Insecurity (12/25/2023)   Hunger Vital Sign    Worried About Running Out of Food in the Last Year: Never true    Ran Out of Food in the Last Year: Never true  Transportation Needs: No Transportation Needs (12/25/2023)   PRAPARE - Administrator, Civil Service (Medical): No    Lack of Transportation (Non-Medical): No  Physical Activity: Insufficiently Active (12/25/2023)   Exercise Vital Sign    Days of Exercise per Week: 7 days    Minutes of Exercise per Session: 10 min  Stress: No Stress Concern Present (12/25/2023)   [place]  Institute of Occupational Health - Occupational Stress Questionnaire    Feeling of Stress : Only a little  Social Connections: Moderately Isolated (12/25/2023)   Social Connection and Isolation Panel    Frequency of Communication with Friends and Family: More than three times a week    Frequency of Social Gatherings with Friends and Family: Once a week    Attends Religious Services: 1 to 4 times per year    Active Member of [employer] or Organizations: No    Attends Engineer, structural: Not on file    Marital Status: Never married     Family History: The patient's family history includes Atrial fibrillation in her father; Cancer in her father and mother; Heart disease in her father; Lymphoma in her father; Non-Hodgkin's lymphoma in her father; Obesity in her sister; Ovarian cancer in her mother.  ROS:   Please see the history of present illness.    All other systems reviewed and are negative.  EKGs/Labs/Other Studies Reviewed:    The following studies were reviewed today: .SABRAEKG Interpretation Date/Time:  Wednesday August 18 2024 08:18:08 EDT Ventricular Rate:  64 PR Interval:  132 QRS Duration:  76 QT Interval:  394 QTC Calculation: 406 R Axis:   82  Text Interpretation: Normal sinus rhythm Normal ECG When  compared with ECG of 21-Aug-2023 08:51, No significant change was found Confirmed by Edwyna Backers [phone]) on 08/18/2024 8:39:53 AM     Recent Labs: 05/26/2024: ALT 15; BUN 17; Creatinine, Ser 0.71; Hemoglobin 12.8; Platelets 205.0; Potassium 4.8; Sodium 141; TSH 1.27  Recent Lipid Panel    Component Value Date/Time   CHOL 184 05/26/2024 0844   TRIG 66.0 05/26/2024 0844   HDL 81.70 05/26/2024 0844   CHOLHDL 2 05/26/2024 0844   VLDL 13.2 05/26/2024 0844   LDLCALC 89 05/26/2024 0844    Physical Exam:    VS:  BP (!) 90/50   Pulse 64   Ht 5' 5 (1.651 m)   Wt 118 lb 0.6 oz (53.5 kg)   SpO2 98%   BMI 19.64 kg/m     Wt Readings from Last 3 Encounters:  08/18/24 118 lb 0.6 oz (53.5 kg)  05/26/24 119 lb 9.6 oz (54.3 kg)  12/25/23 120 lb 3.2 oz (54.5 kg)     GEN: Patient is in no acute distress HEENT: Normal NECK: No JVD; No carotid bruits LYMPHATICS: No lymphadenopathy CARDIAC: Hear sounds regular, 2/6 systolic murmur at the apex. RESPIRATORY:  Clear to auscultation without rales, wheezing or rhonchi  ABDOMEN: Soft, non-tender, non-distended MUSCULOSKELETAL:  No edema; No deformity  SKIN: Warm and dry NEUROLOGIC:  Alert and oriented x 3 PSYCHIATRIC:  Normal affect   Signed, Backers JONELLE Edwyna, MD  08/18/2024 8:46 AM    [facility]

## 2024-08-18 NOTE — Patient Instructions (Signed)
 Medication Instructions:  Your physician has recommended you make the following change in your medication:   Decrease your Metoprolol  to 12.5 mg daily.  *If you need a refill on your cardiac medications before your next appointment, please call your pharmacy*   Lab Work: None ordered If you have labs (blood work) drawn today and your tests are completely normal, you will receive your results only by: MyChart Message (if you have MyChart) OR A paper copy in the mail If you have any lab test that is abnormal or we need to change your treatment, we will call you to review the results.   Testing/Procedures: None ordered   Follow-Up: At [facility], you and your health needs are our priority.  As part of our continuing mission to provide you with exceptional heart care, we have created designated Provider Care Teams.  These Care Teams include your primary Cardiologist (physician) and Advanced Practice Providers (APPs -  Physician Assistants and Nurse Practitioners) who all work together to provide you with the care you need, when you need it.  We recommend signing up for the patient portal called MyChart.  Sign up information is provided on this After Visit Summary.  MyChart is used to connect with patients for Virtual Visits (Telemedicine).  Patients are able to view lab/test results, encounter notes, upcoming appointments, etc.  Non-urgent messages can be sent to your provider as well.   To learn more about what you can do with MyChart, go to [email or web address].    Your next appointment:   12 month(s)  The format for your next appointment:   In Person  Provider:   Jennifer Crape, MD    Other Instructions none  Important Information About Sugar

## 2024-08-21 ENCOUNTER — Encounter: Payer: Self-pay | Admitting: Cardiology

## 2024-08-21 DIAGNOSIS — R002 Palpitations: Secondary | ICD-10-CM

## 2024-08-24 MED ORDER — TOPROL XL 25 MG PO TB24: 12.5000 mg | ORAL_TABLET | Freq: Every day | ORAL | 3 refills | Status: DC

## 2024-08-30 DIAGNOSIS — K2950 Unspecified chronic gastritis without bleeding: Secondary | ICD-10-CM | POA: Diagnosis not present

## 2024-08-30 DIAGNOSIS — K219 Gastro-esophageal reflux disease without esophagitis: Secondary | ICD-10-CM | POA: Diagnosis not present

## 2024-08-30 DIAGNOSIS — K2970 Gastritis, unspecified, without bleeding: Secondary | ICD-10-CM | POA: Diagnosis not present

## 2024-09-07 MED ORDER — TOPROL XL 25 MG PO TB24: 25.0000 mg | ORAL_TABLET | Freq: Every day | ORAL | 3 refills | Status: AC

## 2024-09-07 NOTE — Addendum Note (Signed)
 Addended by: ONEITA BERLINER on: 09/07/2024 08:43 AM   Modules accepted: Orders

## 2024-10-04 ENCOUNTER — Encounter: Payer: Self-pay | Admitting: Family Medicine

## 2024-10-05 DIAGNOSIS — F17210 Nicotine dependence, cigarettes, uncomplicated: Secondary | ICD-10-CM | POA: Diagnosis not present

## 2024-10-06 ENCOUNTER — Encounter: Payer: Self-pay | Admitting: Emergency Medicine

## 2024-10-21 ENCOUNTER — Ambulatory Visit: Admitting: Emergency Medicine

## 2024-10-21 ENCOUNTER — Encounter: Payer: Self-pay | Admitting: Emergency Medicine

## 2024-10-21 ENCOUNTER — Inpatient Hospital Stay
Admission: RE | Admit: 2024-10-21 | Discharge: 2024-10-21 | Disposition: A | Payer: Self-pay | Source: Ambulatory Visit | Attending: Emergency Medicine | Admitting: Emergency Medicine

## 2024-10-21 VITALS — BP 96/59 | HR 65 | Temp 98.0°F | Ht 65.0 in | Wt 119.3 lb

## 2024-10-21 DIAGNOSIS — R911 Solitary pulmonary nodule: Secondary | ICD-10-CM

## 2024-10-21 DIAGNOSIS — F17210 Nicotine dependence, cigarettes, uncomplicated: Secondary | ICD-10-CM | POA: Diagnosis not present

## 2024-10-21 DIAGNOSIS — J449 Chronic obstructive pulmonary disease, unspecified: Secondary | ICD-10-CM

## 2024-10-21 DIAGNOSIS — F17200 Nicotine dependence, unspecified, uncomplicated: Secondary | ICD-10-CM

## 2024-10-21 DIAGNOSIS — K219 Gastro-esophageal reflux disease without esophagitis: Secondary | ICD-10-CM | POA: Diagnosis not present

## 2024-10-21 NOTE — Assessment & Plan Note (Signed)
 Lung cancer screening CT chest reviewed.  Reassuring.  Plan repeat in 1 year.  This will be done at [facility] for insurance purposes

## 2024-10-21 NOTE — Patient Instructions (Signed)
 I am glad that you have been doing well. Keep your albuterol  available to use 2 puffs if you need it for shortness of breath, chest tightness, wheezing. We will hold off on starting any scheduled inhaler medication for now.  We could consider doing so if you develop persistent breathing symptoms Continue to sleep with your head propped up.  I am glad that this has helped your reflux.  Reflux can often be connected to breathing issues We will repeat your lung cancer screening CT scan of the chest at [facility] in October 2026 Continue to follow with Dr. Ubaldo and Dr. Edwyna as planned Follow-up Dr. Shelah in October 2026 to review your CT chest.  Please call sooner if you have any respiratory issues.

## 2024-10-21 NOTE — Assessment & Plan Note (Signed)
 No flares.  Minimal albuterol  use.  Good functional capacity and exercise tolerance.  She has gotten the flu shot, RSV vaccine.  She is questioning getting the COVID-19 vaccine.  Given her mild COPD I think she could defer until age 63 if she would like to do so

## 2024-10-21 NOTE — Addendum Note (Signed)
 Addended by: MELVENIA POSEY R on: 10/21/2024 04:03 PM   Modules accepted: Orders

## 2024-10-21 NOTE — Assessment & Plan Note (Signed)
 She has benefited from a change in diet, propping her head up at night while sleeping.  This has been associated with less albuterol  use.

## 2024-10-21 NOTE — Progress Notes (Signed)
   Subjective:    Patient ID: Michele Jacobs, female    DOB: 30-Jan-1961, 63 y.o.   MRN: [id number]  COPD Her past medical history is significant for COPD.   ROV 10/21/2024 --Michele Jacobs is a 13 with a history of former tobacco use and mild COPD.  She is not currently on any scheduled bronchodilator therapy, has albuterol  available to use if needed.  I have followed her for a right lower lobe opacity that cleared on serial imaging.  She is now in the lung cancer screening program. She walks on the treadmill several times a day. No SOB. She can sometimes experience some dyspnea when she mows or has a dust exposure. Uses albuterol  1x a week. She has dealt with some reflux - she has started propping her head up while sleeping and has improved. May have helped her breathing also - uses albuterol  less.  She follows with Dr. Revankar for her hyperlipidemia, has mild coronary calcifications on her CT chest.  Lung cancer screening CT chest ([facility]) done 10/05/2024 and reviewed by me shows patent central airways, some stable noncalcified plaque along the right upper lobe lateral surface, no suspicious nodules.  Read as a lung RADS 2 study   Review of Systems As per HPI      Objective:   Physical Exam Vitals:   10/21/24 0909  BP: (!) 96/59  Pulse: 65  Temp: 98 F (36.7 C)  TempSrc: Oral  SpO2: 99%  Weight: 119 lb 4.8 oz (54.1 kg)  Height: 5' 5 (1.651 m)   Gen: Pleasant, well-nourished, in no distress,  normal affect  ENT: No lesions,  mouth clear,  oropharynx clear, no postnasal drip  Neck: No JVD, no stridor  Lungs: No use of accessory muscles, no crackles or wheezing on normal respiration, no wheeze on forced expiration  Cardiovascular: RRR, heart sounds normal, no murmur or gallops, no peripheral edema  Musculoskeletal: No deformities, no cyanosis or clubbing  Neuro: alert, awake, non focal  Skin: Warm, no lesions or rash      Assessment & Plan:  COPD, mild (HCC) No flares.   Minimal albuterol  use.  Good functional capacity and exercise tolerance.  She has gotten the flu shot, RSV vaccine.  She is questioning getting the COVID-19 vaccine.  Given her mild COPD I think she could defer until age 8 if she would like to do so  Laryngopharyngeal reflux (LPR) She has benefited from a change in diet, propping her head up at night while sleeping.  This has been associated with less albuterol  use.  Tobacco use disorder Lung cancer screening CT chest reviewed.  Reassuring.  Plan repeat in 1 year.  This will be done at [facility] for insurance purposes    Lamar Chris, MD, PhD 10/21/2024, 9:38 AM [facility] [phone] or if no answer [phone]

## 2024-11-28 NOTE — Progress Notes (Unsigned)
 [facility] [address], [address] [place], [place] [address] [phone]  Date:  11/29/2024   Name:  Michele Jacobs   DOB:  1961-01-20   MRN:  [id number]  PCP:  Watt Harlene BROCKS, MD    Chief Complaint: No chief complaint on file.   History of Present Illness:  Michele Jacobs is a 63 y.o. very pleasant female patient who presents with the following:  Patient seen today for periodic recheck-I saw her most recently in June for her physical History of osteopenia, MVP, reflux, anxiety, palpitations treated with toprol  xl.,  COPD followed by Dr. Shelah  She is being followed with periodic CT for lung nodules, imaging through [facility] We had a CT coronary calcium  earlier this year, 78th percentile-we got her started on Crestor  Gynecology Dr. Latisha  Pulmonology Dr. Dal recent visit on October 30 COPD, mild (HCC) No flares.  Minimal albuterol  use.  Good functional capacity and exercise tolerance.  She has gotten the flu shot, RSV vaccine.  She is questioning getting the COVID-19 vaccine.  Given her mild COPD I think she could defer until age 59 if she would like to do so Laryngopharyngeal reflux (LPR) She has benefited from a change in diet, propping her head up at night while sleeping.  This has been associated with less albuterol  use. Tobacco use disorder Lung cancer screening CT chest reviewed.  Reassuring.  Plan repeat in 1 year.  This will be done at [facility] for insurance purposes  She also saw cardiology at the end of August for a preoperative visit-Dr. Revankar Recommend COVID booster Shingrix Mammogram She needs Prevnar Flu is up-to-date She has completed RSV Complete labs done in June DEXA scan 1 year ago Mammogram completed last December  Pantoprazole  Crestor  10 Alprazolam  as needed Toprol -XL 25 Aspirin  81  Discussed the use of AI scribe software for clinical note transcription with the patient, who gave  verbal consent to proceed.  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  Relaxation techniques described.  Using Xanax  prn.  May call for refills.  Using xanax  1/2 tablet qHS prn.  Last Assessment & Plan:  Worsening since her father became terminally ill. History of the same with similar presentation. 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  Repeat 2014`E1o3L`IMPRESSION: Continue calcium  1200mg  daily with vitamin D .   Ear pain, bilateral 08/22/2014   Last Assessment & Plan:  May be related to eustachian tube dysfunction. Advised antihistamine-decongestant and Flonase Daily for the next 10-14 days. If symptoms do not improve, or if they worsen, notify me.   Gastroesophageal reflux disease 08/19/2023   Hyperlipidemia 10/08/2023   Increased frequency of urination 08/12/2012   Laryngopharyngeal reflux (LPR) 07/15/2017   Mitral valve prolapse    Muscle cramps 08/22/2014   Last Assessment &  Plan:  Relative dehydration possibly. Labs ordered. Advised trial of magnesium supplement, 400-800 milligrams nightly over the next few weeks.   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  The goal for anyone who smokes is to decrease the amount smoked each day and then eventually quit.  Stop smoking groups, printed information and medications are all available for your use when you decide to stop. She ONLY smokes if she has had quite a few drinks. jmw    Past Surgical History:  Procedure Laterality Date   THYROGLOSSAL DUCT CYST  01/2018   removal   TUBAL LIGATION      Social History   Tobacco Use   Smoking status: Former    Current packs/day: 0.00    Average packs/day: 0.5 packs/day for 30.0 years (15.0 ttl pk-yrs)    Types: Cigarettes    Start date: 25    Quit date: 2013    Years since quitting: 12.9   Smokeless tobacco: Never  Vaping Use   Vaping status: Never Used  Substance Use Topics   Alcohol use: No   Drug use: No    Family History  Problem Relation Age of Onset   Ovarian cancer Mother    Cancer Mother    Lymphoma Father    Heart disease Father    Cancer Father    Non-Hodgkin's lymphoma Father    Atrial fibrillation Father    Obesity Sister     Allergies  Allergen Reactions   Codeine Nausea And Vomiting    Medication list has been reviewed and updated.  Current Outpatient Medications on File Prior to Visit  Medication Sig Dispense Refill   albuterol  (VENTOLIN  HFA) 108 (90 Base) MCG/ACT inhaler Inhale 1-2 puffs into the lungs every 6 (six) hours as needed for wheezing or shortness of breath. 18 g 6   ALPRAZolam  (XANAX ) 0.25 MG tablet TAKE 1 TABLET BY MOUTH DAILY AS NEEDED FOR ANXIETY 30 tablet 2   aspirin  EC 81 MG tablet Take 1 tablet (81 mg total) by mouth daily. 90 tablet  3   cholecalciferol (VITAMIN D ) 1000 units tablet Take 1,000 Units by mouth daily.     pantoprazole  (PROTONIX ) 40 MG tablet Take 1 tablet (40 mg total) by mouth daily. 90 tablet 3   rosuvastatin  (CRESTOR ) 10 MG tablet Take 1 tablet (10 mg total) by mouth daily. 90 tablet 3   TOPROL  XL 25 MG 24 hr tablet Take 1 tablet (25 mg total) by mouth daily. Take with or immediately following a meal 90 tablet 3   triamcinolone  cream (KENALOG ) 0.5 % Apply 1 Application topically 2 (two) times daily. Use as needed for skin rash 30 g 0   No current facility-administered medications on file prior to visit.    Review of Systems:  As per HPI- otherwise negative.   Physical Examination: There were no vitals filed for this visit. There were no vitals filed for this  visit. There is no height or weight on file to calculate BMI. Ideal Body Weight:    GEN: no acute distress. HEENT: Atraumatic, Normocephalic.  Ears and Nose: No external deformity. CV: RRR, No M/G/R. No JVD. No thrill. No extra heart sounds. PULM: CTA B, no wheezes, crackles, rhonchi. No retractions. No resp. distress. No accessory muscle use. ABD: S, NT, ND, +BS. No rebound. No HSM. EXTR: No c/c/e PSYCH: Normally interactive. Conversant.    Assessment and Plan: No diagnosis found.  Assessment & Plan   Signed Harlene Schroeder, MD

## 2024-11-28 NOTE — Patient Instructions (Incomplete)
 It was good to see you today, I will be in touch with your lab work Assuming all is well please see me in about 6 months I do recommend getting the shingles vaccine series at your convenience- we will defer today since you are not feeling great and we are doing your pneumonia vaccine

## 2024-11-29 ENCOUNTER — Ambulatory Visit: Admitting: Family Medicine

## 2024-11-29 ENCOUNTER — Encounter: Payer: Self-pay | Admitting: Family Medicine

## 2024-11-29 VITALS — BP 118/68 | HR 74 | Ht 65.0 in | Wt 121.0 lb

## 2024-11-29 DIAGNOSIS — Z131 Encounter for screening for diabetes mellitus: Secondary | ICD-10-CM

## 2024-11-29 DIAGNOSIS — R931 Abnormal findings on diagnostic imaging of heart and coronary circulation: Secondary | ICD-10-CM | POA: Diagnosis not present

## 2024-11-29 DIAGNOSIS — Z23 Encounter for immunization: Secondary | ICD-10-CM | POA: Diagnosis not present

## 2024-11-29 DIAGNOSIS — Z130 Encounter for screening for diseases of the blood and blood-forming organs and certain disorders involving the immune mechanism: Secondary | ICD-10-CM | POA: Diagnosis not present

## 2024-11-29 DIAGNOSIS — J449 Chronic obstructive pulmonary disease, unspecified: Secondary | ICD-10-CM

## 2024-11-29 DIAGNOSIS — K219 Gastro-esophageal reflux disease without esophagitis: Secondary | ICD-10-CM | POA: Diagnosis not present

## 2024-11-29 DIAGNOSIS — E785 Hyperlipidemia, unspecified: Secondary | ICD-10-CM

## 2024-11-29 LAB — CBC
HCT: 37.2 % (ref 36.0–46.0)
Hemoglobin: 12.5 g/dL (ref 12.0–15.0)
MCHC: 33.6 g/dL (ref 30.0–36.0)
MCV: 93.9 fl (ref 78.0–100.0)
Platelets: 185 K/uL (ref 150.0–400.0)
RBC: 3.96 Mil/uL (ref 3.87–5.11)
RDW: 13.6 % (ref 11.5–15.5)
WBC: 4.6 K/uL (ref 4.0–10.5)

## 2024-11-29 LAB — BASIC METABOLIC PANEL WITH GFR
BUN: 18 mg/dL (ref 6–23)
CO2: 31 meq/L (ref 19–32)
Calcium: 9.2 mg/dL (ref 8.4–10.5)
Chloride: 104 meq/L (ref 96–112)
Creatinine, Ser: 0.69 mg/dL (ref 0.40–1.20)
GFR: 93.09 mL/min (ref >60.00)
Glucose, Bld: 77 mg/dL (ref 70–99)
Potassium: 4 meq/L (ref 3.5–5.1)
Sodium: 143 meq/L (ref 135–145)

## 2025-01-25 ENCOUNTER — Ambulatory Visit: Payer: Self-pay

## 2025-01-25 NOTE — Telephone Encounter (Signed)
 Appt scheduled

## 2025-01-26 ENCOUNTER — Encounter: Payer: Self-pay | Admitting: Family Medicine

## 2025-01-26 ENCOUNTER — Ambulatory Visit: Admitting: Family Medicine

## 2025-01-26 VITALS — BP 114/72 | HR 82 | Ht 65.0 in | Wt 120.0 lb

## 2025-01-26 DIAGNOSIS — R531 Weakness: Secondary | ICD-10-CM

## 2025-01-26 DIAGNOSIS — X509XXA Other and unspecified overexertion or strenuous movements or postures, initial encounter: Secondary | ICD-10-CM

## 2025-01-26 DIAGNOSIS — M791 Myalgia, unspecified site: Secondary | ICD-10-CM

## 2025-01-26 MED ORDER — METHOCARBAMOL 500 MG PO TABS: 500.0000 mg | ORAL_TABLET | Freq: Three times a day (TID) | ORAL | 0 refills | Status: AC | PRN

## 2025-05-30 ENCOUNTER — Encounter: Admitting: Family Medicine
